# Patient Record
Sex: Male | Born: 1965 | Race: White | Hispanic: No | Smoking: Current every day smoker
Health system: Southern US, Community
[De-identification: ages and names within clinical notes are randomized; demographics above are authoritative.]

## PROBLEM LIST (undated history)

## (undated) DIAGNOSIS — E7800 Pure hypercholesterolemia, unspecified: Secondary | ICD-10-CM

## (undated) DIAGNOSIS — I10 Essential (primary) hypertension: Secondary | ICD-10-CM

## (undated) DIAGNOSIS — H9190 Unspecified hearing loss, unspecified ear: Secondary | ICD-10-CM

## (undated) HISTORY — PX: FINGER SURGERY: SHX640

## (undated) HISTORY — DX: Essential (primary) hypertension: I10

---

## 2000-05-18 ENCOUNTER — Emergency Department (HOSPITAL_COMMUNITY): Admission: EM | Admit: 2000-05-18 | Discharge: 2000-05-18 | Payer: Self-pay | Admitting: *Deleted

## 2000-05-18 ENCOUNTER — Encounter: Payer: Self-pay | Admitting: *Deleted

## 2000-08-06 ENCOUNTER — Ambulatory Visit (HOSPITAL_COMMUNITY): Admission: RE | Admit: 2000-08-06 | Discharge: 2000-08-06 | Payer: Self-pay | Admitting: Pulmonary Disease

## 2011-10-28 ENCOUNTER — Emergency Department (HOSPITAL_COMMUNITY)
Admission: EM | Admit: 2011-10-28 | Discharge: 2011-10-28 | Disposition: A | Discharge status: Home / Self Care | Payer: Self-pay | Attending: Emergency Medicine | Admitting: Emergency Medicine

## 2011-10-28 ENCOUNTER — Encounter (HOSPITAL_COMMUNITY): Payer: Self-pay

## 2011-10-28 DIAGNOSIS — F17200 Nicotine dependence, unspecified, uncomplicated: Secondary | ICD-10-CM | POA: Insufficient documentation

## 2011-10-28 DIAGNOSIS — M533 Sacrococcygeal disorders, not elsewhere classified: Secondary | ICD-10-CM | POA: Insufficient documentation

## 2011-10-28 MED ORDER — DOXYCYCLINE HYCLATE 100 MG PO TABS
100.0000 mg | ORAL_TABLET | Freq: Once | ORAL | Status: AC
  Administered 2011-10-28: 100 mg via ORAL
  Filled 2011-10-28: qty 1

## 2011-10-28 MED ORDER — DOXYCYCLINE HYCLATE 100 MG PO CAPS: 100.0000 mg | ORAL_CAPSULE | Freq: Two times a day (BID) | ORAL | Status: DC

## 2011-10-28 MED ORDER — OXYCODONE-ACETAMINOPHEN 5-325 MG PO TABS
1.0000 | ORAL_TABLET | Freq: Once | ORAL | Status: AC
  Administered 2011-10-28: 1 via ORAL
  Filled 2011-10-28: qty 1

## 2011-10-28 MED ORDER — IBUPROFEN 800 MG PO TABS
800.0000 mg | ORAL_TABLET | Freq: Once | ORAL | Status: AC
  Administered 2011-10-28: 800 mg via ORAL
  Filled 2011-10-28: qty 1

## 2011-10-28 MED ORDER — OXYCODONE-ACETAMINOPHEN 5-325 MG PO TABS: ORAL_TABLET | ORAL | Status: DC

## 2011-10-28 NOTE — ED Provider Notes (Signed)
Medical screening examination/treatment/procedure(s) were performed by non-physician practitioner and as supervising physician I was immediately available for consultation/collaboration.  John-Adam Zohar Maroney, M.D.     John-Adam Taneah Masri, MD 10/28/11 1619 

## 2011-10-28 NOTE — ED Provider Notes (Signed)
History     CSN: [id number]  Arrival date & time 10/28/11  1015   First MD Initiated Contact with Patient 10/28/11 1046      Chief Complaint  Patient presents with  . Abscess    (Consider location/radiation/quality/duration/timing/severity/associated sxs/prior treatment) HPI Comments: Area painful.  States he could feel a swollen area while taking a shower this AM.  No fever or chills.  Had one abscess ~ 3 yrs ago.  No PCP   Patient is a 46 y.o. male presenting with abscess. The history is provided by the patient. No language interpreter was used.  Abscess  This is a new problem. Episode onset: 3-4 days ago. The problem occurs continuously. The problem has been unchanged. Affected Location: pilonidal region. The abscess first occurred at home. Pertinent negatives include no fever. His past medical history does not include atopy in family or skin abscesses in family. He has received no recent medical care.    History reviewed. No pertinent past medical history.  History reviewed. No pertinent past surgical history.  No family history on file.  History  Substance Use Topics  . Smoking status: Current Every Day Smoker  . Smokeless tobacco: Not on file  . Alcohol Use: Yes      Review of Systems  Constitutional: Negative for fever and chills.  Gastrointestinal: Negative for rectal pain.  Skin: Negative for wound.  All other systems reviewed and are negative.    Allergies  Review of patient's allergies indicates no known allergies.  Home Medications   Current Outpatient Rx  Name Route Sig Dispense Refill  . IBUPROFEN 200 MG PO TABS Oral Take 400 mg by mouth every 6 (six) hours as needed. Pain      BP 175/97  Pulse 90  Temp 98.5 F (36.9 C) (Oral)  Resp 19  Wt 170 lb (77.111 kg)  SpO2 100%  Physical Exam  Nursing note and vitals reviewed. Constitutional: He is oriented to person, place, and time. He appears well-developed and well-nourished.  HENT:  Head:  Normocephalic and atraumatic.  Eyes: EOM are normal.  Neck: Normal range of motion.  Cardiovascular: Normal rate, regular rhythm and intact distal pulses.   Pulmonary/Chest: Effort normal. No respiratory distress.  Abdominal: Soft. He exhibits no distension. There is no tenderness.  Musculoskeletal: Normal range of motion.  Neurological: He is alert and oriented to person, place, and time.  Skin: Skin is warm and dry.       Pain and PT in pilonidal region.  No visible or palpable induration or fluctuance.    Psychiatric: He has a normal mood and affect. Judgment normal.    ED Course  Procedures (including critical care time)  Labs Reviewed - No data to display No results found.   1. Sacral pain       MDM  Warm compresses. rx-doxycycline, 20 rx-percocet, 20 Ibuprofen Return prn        Evalina Field, [place] 10/28/11 1253

## 2011-10-28 NOTE — ED Notes (Signed)
Patient with no complaints at this time. Respirations even and unlabored. Skin warm/dry. Discharge instructions reviewed with patient at this time. Patient given opportunity to voice concerns/ask questions. Patient discharged at this time and left Emergency Department with steady gait.   

## 2011-10-28 NOTE — ED Notes (Signed)
C/o "knot" to lower back where buttocks begins

## 2017-09-29 ENCOUNTER — Other Ambulatory Visit: Payer: Self-pay

## 2017-09-29 ENCOUNTER — Emergency Department (HOSPITAL_COMMUNITY): Payer: Self-pay

## 2017-09-29 ENCOUNTER — Encounter (HOSPITAL_COMMUNITY): Payer: Self-pay | Admitting: Emergency Medicine

## 2017-09-29 ENCOUNTER — Emergency Department (HOSPITAL_COMMUNITY)
Admission: EM | Admit: 2017-09-29 | Discharge: 2017-09-29 | Disposition: A | Discharge status: Home / Self Care | Payer: Self-pay | Attending: Emergency Medicine | Admitting: Emergency Medicine

## 2017-09-29 DIAGNOSIS — F17210 Nicotine dependence, cigarettes, uncomplicated: Secondary | ICD-10-CM | POA: Insufficient documentation

## 2017-09-29 DIAGNOSIS — M87051 Idiopathic aseptic necrosis of right femur: Secondary | ICD-10-CM

## 2017-09-29 DIAGNOSIS — Z79899 Other long term (current) drug therapy: Secondary | ICD-10-CM | POA: Insufficient documentation

## 2017-09-29 DIAGNOSIS — M87059 Idiopathic aseptic necrosis of unspecified femur: Secondary | ICD-10-CM | POA: Insufficient documentation

## 2017-09-29 DIAGNOSIS — M47816 Spondylosis without myelopathy or radiculopathy, lumbar region: Secondary | ICD-10-CM | POA: Insufficient documentation

## 2017-09-29 MED ORDER — MELOXICAM 15 MG PO TABS: 15.0000 mg | ORAL_TABLET | Freq: Every day | ORAL | 0 refills | Status: AC

## 2017-09-29 MED ORDER — LIDOCAINE 5 % EX PTCH: 1.0000 | MEDICATED_PATCH | CUTANEOUS | 0 refills | Status: DC

## 2017-09-29 NOTE — ED Triage Notes (Signed)
Patient c/o right hip since he fell off ladder after tree limb knocked him off during a strom x3 months ago. Per patient pain radiates from right hip into leg. Patient ambulatory. Patient denies being seen after fall. Patient reports taking Aleve, muscle relaxer, motrin, and naproxen with no relief. Pedal pulse present,strong. CNS intact. Denies any complications with BMs or urination.

## 2017-09-29 NOTE — Discharge Instructions (Addendum)
Thank you for allowing me to care for you today in the Emergency Department.   Call Dr. Romeo Apple to schedule a follow-up appointment.  You can also follow-up with the free clinic of [address] or with [facility] in [place].  There are some other treatments and medications that he may be able to prescribe from you from the office that may help to improve your pain.  Take 1 tablet of meloxicam daily.  Do not take ibuprofen, Motrin, Aleve, or naproxen while taking this medication as a work the same.  You should not take Tylenol if you drink alcohol.  You can apply 1 lidocaine patch to areas that are sore every 12 hours as needed.  Return to the emergency department if you develop new or worsening symptoms including if you have any fall or injury, if you develop new numbness or weakness in the right leg, if you start having problems and are peeing or pooping on yourself, or if you develop other new, concerning symptoms.

## 2017-09-29 NOTE — ED Provider Notes (Signed)
[facility] EMERGENCY DEPARTMENT Provider Note   CSN: [id number] Arrival date & time: 09/29/17  1253     History   Chief Complaint Chief Complaint  Patient presents with  . Hip Pain    HPI Cameron Flynn is a 52 y.o. 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  Patient reports that he was approximately 3 steps high on a ladder while repairing a roof when a tree limb knocked him off the ladder and he fell onto his back 3 months ago.  He reports that he was able to get up from the accident and was ambulatory after the fall.  He reports that he has been having right-sided hip pain that radiates down into the right thigh for the last 3 months.  He states that the pain initially radiated into the right foot, but now stops at the mid thigh and in the right groin.  He denies penile or testicular pain or swelling, urinary or fecal incontinence, saddle paresthesias, numbness, or weakness.  He is unable to characterize the pain.  He states that initially he became unable to walk 2 days after the accident, but borrowed a walker from a friend and was able to get himself up and walking over the next few days.  He states that he has not used a walker in some time.  He is been treating his symptoms at home with muscle relaxers that he received from a friend, Motrin, and naproxen but reports that the pain has not started to improve.  He reports that several years ago that he had an injury to the low back and was diagnosed with bulging disc in the lumbar spine.  He followed up with a chiropractor, but has not been evaluated in several years.  The history is provided by the patient. No language interpreter was used.    History reviewed. No pertinent past medical history.  There are no active problems to display for this patient.   Past Surgical History:  Procedure Laterality Date  . FINGER SURGERY           Home Medications    Prior to Admission medications   Medication Sig Start Date End Date Taking? Authorizing Provider  doxycycline (VIBRAMYCIN) 100 MG capsule Take 1 capsule (100 mg total) by mouth 2 (two) times daily. 10/28/11   Worthy Rancher, PA-C  ibuprofen (ADVIL,MOTRIN) 200 MG tablet Take 400 mg by mouth every 6 (six) hours as needed. Pain    [provider]  lidocaine (LIDODERM) 5 % Place 1 patch onto the skin daily. Remove & Discard patch within 12 hours or as directed by MD 09/29/17   Gay Rape A, PA-C  meloxicam (MOBIC) 15 MG tablet Take 1 tablet (15 mg total) by mouth daily for 14 days. 09/29/17 10/13/17  Jerra Huckeby A, PA-C  oxyCODONE-acetaminophen (PERCOCET/ROXICET) 5-325 MG per tablet One po q 6 hrs prn pain 10/28/11   Worthy Rancher, PA-C    Family History History reviewed. No pertinent family history.  Social History Social History   Tobacco Use  . Smoking status: Current Every Day Smoker    Packs/day: 2.00    Types: Cigarettes  . Smokeless tobacco: Never Used  Substance Use Topics  . Alcohol use: Yes    Comment: occasional  . Drug use: Yes    Types: Marijuana     Allergies   Patient has no  known allergies.   Review of Systems Review of Systems  Constitutional: Negative for activity change, chills and fever.  Respiratory: Negative for shortness of breath.   Cardiovascular: Negative for chest pain.  Gastrointestinal: Negative for abdominal pain.  Genitourinary: Negative for flank pain, hematuria, penile pain, scrotal swelling and testicular pain.  Musculoskeletal: Positive for arthralgias, back pain, gait problem and myalgias.  Skin: Negative for rash.  Neurological: Negative for weakness and numbness.     Physical Exam Updated Vital Signs BP (!) 154/88 (BP Location: Right Arm)   Pulse 82   Temp 98.9 F (37.2 C) (Oral)   Resp 17   Ht 5\' 11"  (1.803 m)   Wt 83.9 kg   SpO2 97%   BMI 25.80 kg/m   Physical Exam   Constitutional: He appears well-developed.  HENT:  Head: Normocephalic.  Eyes: Conjunctivae are normal.  Neck: Neck supple.  Cardiovascular: Normal rate and regular rhythm.  No murmur heard. Pulmonary/Chest: Effort normal.  Abdominal: Soft. He exhibits no distension and no mass. There is no tenderness. There is no rebound and no guarding. No hernia.  Musculoskeletal: He exhibits tenderness. He exhibits no edema or deformity.  Tender to palpation over the right SI joint.  No tenderness to the spinous processes of the cervical or thoracic spine.  Mild tenderness to the spinous processes of the lumbar spine.  No obvious deformities, step-offs, or crepitus.  Negative straight leg raise bilaterally.  Tender to palpation over the right anterolateral hip.  Full active and passive range of motion of the right hip, knee, and ankle.  5 out of 5 strength against resistance with dorsiflexion and plantarflexion of the bilateral lower extremities.  Sensation is intact and equal throughout.  Increased pain with range of motion of the right hip.  No overlying erythema, edema, or warmth.  Exam of the right knee and ankle are unremarkable.  Neurological: He is alert.  Skin: Skin is warm and dry.  Psychiatric: His behavior is normal.  Nursing note and vitals reviewed.    ED Treatments / Results  Labs (all labs ordered are listed, but only abnormal results are displayed) Labs Reviewed - No data to display  EKG None  Radiology Dg Lumbar Spine Complete  Result Date: 09/29/2017 CLINICAL DATA:  Pain following fall 3 months prior EXAM: LUMBAR SPINE - COMPLETE 4+ VIEW COMPARISON:  None. FINDINGS: Frontal, lateral, spot lumbosacral lateral, and bilateral oblique views were obtained. There are 5 non-rib-bearing lumbar type vertebral bodies. There is no appreciable fracture or spondylolisthesis. There is fairly mild disc space narrowing at L3-4 and L4-5 with moderate disc space narrowing at L5-S1. There are  prominent anterior and lateral osteophytes at L3 and L4. There is facet osteoarthritic change at L4-5 and L5-S1 bilaterally and at L3-4 on the left. There is aortoiliac atherosclerosis. 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 No fracture or spondylolisthesis. There is aortoiliac atherosclerosis. Aortic Atherosclerosis (ICD10-I70.0). Electronically Signed   By: Bretta Bang III M.D.   On: 09/29/2017 15:10   Dg Hip Unilat W Or Wo Pelvis 2-3 Views Right  Result Date: 09/29/2017 CLINICAL DATA:  Pain following fall 3 months prior EXAM: DG HIP (WITH OR WITHOUT PELVIS) 2-3V RIGHT COMPARISON:  None. FINDINGS: Frontal pelvis as well as frontal and lateral right hip images were obtained. No fracture or dislocation. There are areas of mixed sclerosis and lucency in the femoral heads, concerning for avascular necrosis in these areas. No flattening of  the femoral heads is seen. There is slight symmetric narrowing of each hip joint. Sacroiliac joints bilaterally appear normal. IMPRESSION: Slight narrowing of each hip joint. Mixed sclerosis and lucency in the femoral heads, likely indicating areas of avascular necrosis bilaterally. No fracture or dislocation evident. No femoral head flattening currently evident. From an imaging standpoint, MR of the hips nonemergently would be the optimum imaging study of choice to assess for avascular necrosis. Electronically Signed   By: Bretta Bang III M.D.   On: 09/29/2017 15:12    Procedures Procedures (including critical care time)  Medications Ordered in ED Medications - No data to display   Initial Impression / Assessment and Plan / ED Course  I have reviewed the triage vital signs and the nursing notes.  Pertinent labs & imaging results that were available during my care of the patient were reviewed by me and considered in my medical decision making (see chart for details).     52 year old male presenting  with right hip pain after he had a mechanical fall onto his back 3 months ago.  He is also having some right groin pain, but no saddle paresthesias, numbness, weakness, or urinary or fecal incontinence.  X-ray of the lumbar spine with osteoarthritic changes throughout the lumbar spine, most notably at L5-S1.  No fracture or spondylolisthesis.  There is some aorto iliac atherosclerosis.  X-ray of the right hip concerning for avascular necrosis without femoral head flattening.  Discussed these findings with the patient and his significant other.  Given the chronicity of his symptoms, I do not feel that narcotic medication is indicated at this time.  We will give meloxicam for pain control as the patient drinks alcohol regularly.  I have also advised him to follow-up with Dr. Romeo Apple and with the [facility] clinic given the atherosclerotic plaques that were seen on imaging.  Strict return precautions given.  He is hemodynamically stable and in no acute distress.  He is safe for discharge to home with outpatient follow-up at this time.  Final Clinical Impressions(s) / ED Diagnoses   Final diagnoses:  Avascular necrosis of bone of right hip (HCC)  Spondylosis of lumbar region without myelopathy or radiculopathy    ED Discharge Orders         Ordered    meloxicam (MOBIC) 15 MG tablet  Daily     09/29/17 1536    lidocaine (LIDODERM) 5 %  Every 24 hours     09/29/17 1536           Nyal Schachter A, PA-C 09/29/17 1545    Donnetta Hutching, MD 09/30/17 2132

## 2017-10-01 ENCOUNTER — Telehealth: Payer: Self-pay | Admitting: Orthopedic Surgery

## 2017-10-01 NOTE — Telephone Encounter (Signed)
ok 

## 2017-10-01 NOTE — Telephone Encounter (Signed)
Patient called, had been seen at [facility] Emergency room visit 09/29/17 (not on call on this date per call schedule) for problem of leg pain, said related to a fall 3 months ago from a ladder. Had Xrays at [facility] 09/29/17. Okay for next new patient slot? Patient aware of self-pay protocol.

## 2017-10-02 NOTE — Telephone Encounter (Signed)
Called back to patient; scheduled accordingly. °

## 2017-10-10 ENCOUNTER — Ambulatory Visit: Payer: Self-pay | Admitting: Physician Assistant

## 2017-10-10 ENCOUNTER — Encounter: Payer: Self-pay | Admitting: Physician Assistant

## 2017-10-10 ENCOUNTER — Other Ambulatory Visit (HOSPITAL_COMMUNITY)
Admission: RE | Admit: 2017-10-10 | Discharge: 2017-10-10 | Disposition: A | Discharge status: Home / Self Care | Payer: Self-pay | Source: Ambulatory Visit | Attending: Physician Assistant | Admitting: Physician Assistant

## 2017-10-10 VITALS — BP 190/98 | HR 67 | Temp 97.5°F | Ht 68.5 in | Wt 183.5 lb

## 2017-10-10 DIAGNOSIS — Z125 Encounter for screening for malignant neoplasm of prostate: Secondary | ICD-10-CM | POA: Insufficient documentation

## 2017-10-10 DIAGNOSIS — I700 Atherosclerosis of aorta: Secondary | ICD-10-CM

## 2017-10-10 DIAGNOSIS — I10 Essential (primary) hypertension: Secondary | ICD-10-CM

## 2017-10-10 DIAGNOSIS — F1090 Alcohol use, unspecified, uncomplicated: Secondary | ICD-10-CM

## 2017-10-10 DIAGNOSIS — Z13220 Encounter for screening for lipoid disorders: Secondary | ICD-10-CM | POA: Insufficient documentation

## 2017-10-10 DIAGNOSIS — Z131 Encounter for screening for diabetes mellitus: Secondary | ICD-10-CM

## 2017-10-10 DIAGNOSIS — F17200 Nicotine dependence, unspecified, uncomplicated: Secondary | ICD-10-CM

## 2017-10-10 DIAGNOSIS — Z7689 Persons encountering health services in other specified circumstances: Secondary | ICD-10-CM

## 2017-10-10 DIAGNOSIS — Z789 Other specified health status: Secondary | ICD-10-CM

## 2017-10-10 LAB — HEMOGLOBIN A1C
Hgb A1c MFr Bld: 6.1 % — ABNORMAL HIGH (ref 4.8–5.6)
MEAN PLASMA GLUCOSE: 128.37 mg/dL

## 2017-10-10 LAB — COMPREHENSIVE METABOLIC PANEL
ALT: 52 U/L — ABNORMAL HIGH (ref 0–44)
AST: 35 U/L (ref 15–41)
Albumin: 4.3 g/dL (ref 3.5–5.0)
Alkaline Phosphatase: 89 U/L (ref 38–126)
Anion gap: 10 (ref 5–15)
BUN: 10 mg/dL (ref 6–20)
CHLORIDE: 102 mmol/L (ref 98–111)
CO2: 25 mmol/L (ref 22–32)
Calcium: 9.1 mg/dL (ref 8.9–10.3)
Creatinine, Ser: 0.89 mg/dL (ref 0.61–1.24)
Glucose, Bld: 118 mg/dL — ABNORMAL HIGH (ref 70–99)
POTASSIUM: 4.3 mmol/L (ref 3.5–5.1)
SODIUM: 137 mmol/L (ref 135–145)
Total Bilirubin: 1 mg/dL (ref 0.3–1.2)
Total Protein: 7.9 g/dL (ref 6.5–8.1)

## 2017-10-10 LAB — LIPID PANEL
CHOL/HDL RATIO: 4.6 ratio
CHOLESTEROL: 206 mg/dL — AB (ref 0–200)
HDL: 45 mg/dL (ref >40)
LDL Cholesterol: 145 mg/dL — ABNORMAL HIGH (ref 0–99)
TRIGLYCERIDES: 81 mg/dL (ref <150)
VLDL: 16 mg/dL (ref 0–40)

## 2017-10-10 LAB — PSA: PROSTATIC SPECIFIC ANTIGEN: 0.25 ng/mL (ref 0.00–4.00)

## 2017-10-10 MED ORDER — LISINOPRIL 20 MG PO TABS: 20.0000 mg | ORAL_TABLET | Freq: Every day | ORAL | 1 refills | Status: DC

## 2017-10-10 MED ORDER — CLONIDINE HCL 0.1 MG PO TABS
0.1000 mg | ORAL_TABLET | Freq: Once | ORAL | Status: AC
  Administered 2017-10-10: 0.1 mg via ORAL

## 2017-10-10 NOTE — Patient Instructions (Signed)
Smoking Tobacco Information Smoking tobacco will very likely harm your health. Tobacco contains a poisonous (toxic), colorless chemical called nicotine. Nicotine affects the brain and makes tobacco addictive. This change in your brain can make it hard to stop smoking. Tobacco also has other toxic chemicals that can hurt your body and raise your risk of many cancers. How can smoking tobacco affect me? Smoking tobacco can increase your chances of having serious health conditions, such as:  Cancer. Smoking is most commonly associated with lung cancer, but can lead to cancer in other parts of the body.  Chronic obstructive pulmonary disease (COPD). This is a long-term lung condition that makes it hard to breathe. It also gets worse over time.  High blood pressure (hypertension), heart disease, stroke, or heart attack.  Lung infections, such as pneumonia.  Cataracts. This is when the lenses in the eyes become clouded.  Digestive problems. This may include peptic ulcers, heartburn, and gastroesophageal reflux disease (GERD).  Oral health problems, such as gum disease and tooth loss.  Loss of taste and smell.  Smoking can affect your appearance by causing:  Wrinkles.  Yellow or stained teeth, fingers, and fingernails.  Smoking tobacco can also affect your social life.  Many workplaces, restaurants, hotels, and public places are tobacco-free. This means that you may experience challenges in finding places to smoke when away from home.  The cost of a smoking habit can be expensive. Expenses for someone who smokes come in two ways: ? You spend money on a regular basis to buy tobacco. ? Your health care costs in the long-term are higher if you smoke.  Tobacco smoke can also affect the health of those around you. Children of smokers have greater chances of: ? Sudden infant death syndrome (SIDS). ? Ear infections. ? Lung infections.  What lifestyle changes can be made?  Do not start  smoking. Quit if you already do.  To quit smoking: ? Make a plan to quit smoking and commit yourself to it. Look for programs to help you and ask your health care provider for recommendations and ideas. ? Talk with your health care provider about using nicotine replacement medicines to help you quit. Medicine replacement medicines include gum, lozenges, patches, sprays, or pills. ? Do not replace cigarette smoking with electronic cigarettes, which are commonly called e-cigarettes. The safety of e-cigarettes is not known, and some may contain harmful chemicals. ? Avoid places, people, or situations that tempt you to smoke. ? If you try to quit but return to smoking, don't give up hope. It is very common for people to try a number of times before they fully succeed. When you feel ready again, give it another try.  Quitting smoking might affect the way you eat as well as your weight. Be prepared to monitor your eating habits. Get support in planning and following a healthy diet.  Ask your health care provider about having regular tests (screenings) to check for cancer. This may include blood tests, imaging tests, and other tests.  Exercise regularly. Consider taking walks, joining a gym, or doing yoga or exercise classes.  Develop skills to manage your stress. These skills include meditation. What are the benefits of quitting smoking? By quitting smoking, you may:  Lower your risk of getting cancer and other diseases caused by smoking.  Live longer.  Breathe better.  Lower your blood pressure and heart rate.  Stop your addiction to tobacco.  Stop creating secondhand smoke that hurts other people.  Improve your   sense of taste and smell.  Look better over time, due to having fewer wrinkles and less staining.  What can happen if changes are not made? If you do not stop smoking, you may:  Get cancer and other diseases.  Develop COPD or other long-term (chronic) lung  conditions.  Develop serious problems with your heart and blood vessels (cardiovascular system).  Need more tests to screen for problems caused by smoking.  Have higher, long-term healthcare costs from medicines or treatments related to smoking.  Continue to have worsening changes in your lungs, mouth, and nose.  Where to find support: To get support to quit smoking, consider:  Asking your health care provider for more information and resources.  Taking classes to learn more about quitting smoking.  Looking for local organizations that offer resources about quitting smoking.  Joining a support group for people who want to quit smoking in your local community.  Where to find more information: You may find more information about quitting smoking from:  HelpGuide.org: www.helpguide.org/articles/addictions/how-to-quit-smoking.htm  Smokefree.gov: smokefree.gov  American Lung Association: www.lung.org  Contact a health care provider if:  You have problems breathing.  Your lips, nose, or fingers turn blue.  You have chest pain.  You are coughing up blood.  You feel faint or you pass out.  You have other noticeable changes that cause you to worry. Summary  Smoking tobacco can negatively affect your health, the health of those around you, your finances, and your social life.  Do not start smoking. Quit if you already do. If you need help quitting, ask your health care provider.  Think about joining a support group for people who want to quit smoking in your local community. There are many effective programs that will help you to quit this behavior. This information is not intended to replace advice given to you by your health care provider. Make sure you discuss any questions you have with your health care provider. Document Released: 01/03/2016 Document Revised: 01/03/2016 Document Reviewed: 01/03/2016 Elsevier Interactive Patient Education  2018 [employer]  Inc[employer]     Hypertension Hypertension, commonly called high blood pressure, is when the force of blood pumping through the arteries is too strong. The arteries are the blood vessels that carry blood from the heart throughout the body. Hypertension forces the heart to work harder to pump blood and may cause arteries to become narrow or stiff. Having untreated or uncontrolled hypertension can cause heart attacks, strokes, kidney disease, and other problems. A blood pressure reading consists of a higher number over a lower number. Ideally, your blood pressure should be below 120/80. The first ("top") number is called the systolic pressure. It is a measure of the pressure in your arteries as your heart beats. The second ("bottom") number is called the diastolic pressure. It is a measure of the pressure in your arteries as the heart relaxes. What are the causes? The cause of this condition is not known. What increases the risk? Some risk factors for high blood pressure are under your control. Others are not. Factors you can change  Smoking.  Having type 2 diabetes mellitus, high cholesterol, or both.  Not getting enough exercise or physical activity.  Being overweight.  Having too much fat, sugar, calories, or salt (sodium) in your diet.  Drinking too much alcohol. Factors that are difficult or impossible to change  Having chronic kidney disease.  Having a family history of high blood pressure.  Age. Risk increases with age.  Race. You may   be at higher risk if you are African-American.  Gender. Men are at higher risk than women before age 45. After age 65, women are at higher risk than men.  Having obstructive sleep apnea.  Stress. What are the signs or symptoms? Extremely high blood pressure (hypertensive crisis) may cause:  Headache.  Anxiety.  Shortness of breath.  Nosebleed.  Nausea and vomiting.  Severe chest pain.  Jerky movements you cannot control  (seizures).  How is this diagnosed? This condition is diagnosed by measuring your blood pressure while you are seated, with your arm resting on a surface. The cuff of the blood pressure monitor will be placed directly against the skin of your upper arm at the level of your heart. It should be measured at least twice using the same arm. Certain conditions can cause a difference in blood pressure between your right and left arms. Certain factors can cause blood pressure readings to be lower or higher than normal (elevated) for a short period of time:  When your blood pressure is higher when you are in a health care provider's office than when you are at home, this is called white coat hypertension. Most people with this condition do not need medicines.  When your blood pressure is higher at home than when you are in a health care provider's office, this is called masked hypertension. Most people with this condition may need medicines to control blood pressure.  If you have a high blood pressure reading during one visit or you have normal blood pressure with other risk factors:  You may be asked to return on a different day to have your blood pressure checked again.  You may be asked to monitor your blood pressure at home for 1 week or longer.  If you are diagnosed with hypertension, you may have other blood or imaging tests to help your health care provider understand your overall risk for other conditions. How is this treated? This condition is treated by making healthy lifestyle changes, such as eating healthy foods, exercising more, and reducing your alcohol intake. Your health care provider may prescribe medicine if lifestyle changes are not enough to get your blood pressure under control, and if:  Your systolic blood pressure is above 130.  Your diastolic blood pressure is above 80.  Your personal target blood pressure may vary depending on your medical conditions, your age, and other  factors. Follow these instructions at home: Eating and drinking  Eat a diet that is high in fiber and potassium, and low in sodium, added sugar, and fat. An example eating plan is called the DASH (Dietary Approaches to Stop Hypertension) diet. To eat this way: ? Eat plenty of fresh fruits and vegetables. Try to fill half of your plate at each meal with fruits and vegetables. ? Eat whole grains, such as whole wheat pasta, brown rice, or whole grain bread. Fill about one quarter of your plate with whole grains. ? Eat or drink low-fat dairy products, such as skim milk or low-fat yogurt. ? Avoid fatty cuts of meat, processed or cured meats, and poultry with skin. Fill about one quarter of your plate with lean proteins, such as fish, chicken without skin, beans, eggs, and tofu. ? Avoid premade and processed foods. These tend to be higher in sodium, added sugar, and fat.  Reduce your daily sodium intake. Most people with hypertension should eat less than 1,500 mg of sodium a day.  Limit alcohol intake to no more than 1 drink   a day for nonpregnant women and 2 drinks a day for men. One drink equals 12 oz of beer, 5 oz of wine, or 1 oz of hard liquor. Lifestyle  Work with your health care provider to maintain a healthy body weight or to lose weight. Ask what an ideal weight is for you.  Get at least 30 minutes of exercise that causes your heart to beat faster (aerobic exercise) most days of the week. Activities may include walking, swimming, or biking.  Include exercise to strengthen your muscles (resistance exercise), such as pilates or lifting weights, as part of your weekly exercise routine. Try to do these types of exercises for 30 minutes at least 3 days a week.  Do not use any products that contain nicotine or tobacco, such as cigarettes and e-cigarettes. If you need help quitting, ask your health care provider.  Monitor your blood pressure at home as told by your health care provider.  Keep  all follow-up visits as told by your health care provider. This is important. Medicines  Take over-the-counter and prescription medicines only as told by your health care provider. Follow directions carefully. Blood pressure medicines must be taken as prescribed.  Do not skip doses of blood pressure medicine. Doing this puts you at risk for problems and can make the medicine less effective.  Ask your health care provider about side effects or reactions to medicines that you should watch for. Contact a health care provider if:  You think you are having a reaction to a medicine you are taking.  You have headaches that keep coming back (recurring).  You feel dizzy.  You have swelling in your ankles.  You have trouble with your vision. Get help right away if:  You develop a severe headache or confusion.  You have unusual weakness or numbness.  You feel faint.  You have severe pain in your chest or abdomen.  You vomit repeatedly.  You have trouble breathing. Summary  Hypertension is when the force of blood pumping through your arteries is too strong. If this condition is not controlled, it may put you at risk for serious complications.  Your personal target blood pressure may vary depending on your medical conditions, your age, and other factors. For most people, a normal blood pressure is less than 120/80.  Hypertension is treated with lifestyle changes, medicines, or a combination of both. Lifestyle changes include weight loss, eating a healthy, low-sodium diet, exercising more, and limiting alcohol. This information is not intended to replace advice given to you by your health care provider. Make sure you discuss any questions you have with your health care provider. Document Released: 12/18/2004 Document Revised: 11/16/2015 Document Reviewed: 11/16/2015 Elsevier Interactive Patient Education  2018 [employer]vier Inc.   

## 2017-10-10 NOTE — Progress Notes (Signed)
BP (!) 190/98   Pulse 67   Temp (!) 97.5 F (36.4 C)   Ht 5' 8.5" (1.74 m)   Wt 183 lb 8 oz (83.2 kg)   SpO2 100%   BMI 27.50 kg/m    Subjective:    Patient ID: Cameron Flynn, male    DOB: 1965/07/15, 52 y.o.   MRN: [id number]  HPI: Cameron Flynn is a 52 y.o. male presenting on 10/10/2017 for No chief complaint on file.   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  He has appointment to see orthopedics on 10/21/17.  Pt had a fall off a ladder about 3 months ago.  Pt states no PCP since he was a teenager.   Pt is pleasant but is a difficult historian and has trouble answering even simple questions- he seems to overthink every question even easy ones like does your back hurt. He denies HA, CP, vision changes.  Of note pt smokes 2ppd and drinks at least a 6 pack of beer or more daily.   Relevant past medical, surgical, family and social history reviewed and updated as indicated. Interim medical history since our last visit reviewed. Allergies and medications reviewed and updated.   Current Outpatient Medications:  .  meloxicam (MOBIC) 15 MG tablet, Take 1 tablet (15 mg total) by mouth daily for 14 days., Disp: 14 tablet, Rfl: 0 .  lidocaine (LIDODERM) 5 %, Place 1 patch onto the skin daily. Remove & Discard patch within 12 hours or as directed by MD (Patient not taking: Reported on 10/10/2017), Disp: 30 patch, Rfl: 0   Review of Systems  Constitutional: Negative for appetite change, chills, diaphoresis, fatigue, fever and unexpected weight change.  HENT: Positive for hearing loss. Negative for congestion, dental problem, drooling, ear pain, facial swelling, mouth sores, sneezing, sore throat, trouble swallowing and voice change.   Eyes: Negative for pain, discharge, redness, itching and visual disturbance.  Respiratory: Negative for cough, choking, shortness of breath and wheezing.    Cardiovascular: Negative for chest pain, palpitations and leg swelling.  Gastrointestinal: Negative for abdominal pain, blood in stool, constipation, diarrhea and vomiting.  Endocrine: Negative for cold intolerance, heat intolerance and polydipsia.  Genitourinary: Negative for decreased urine volume, dysuria and hematuria.  Musculoskeletal: Negative for arthralgias, back pain and gait problem.  Skin: Negative for rash.  Allergic/Immunologic: Negative for environmental allergies.  Neurological: Negative for seizures, syncope, light-headedness and headaches.  Hematological: Negative for adenopathy.  Psychiatric/Behavioral: Negative for agitation, dysphoric mood and suicidal ideas. The patient is not nervous/anxious.     Per HPI unless specifically indicated above     Objective:    BP (!) 190/98   Pulse 67   Temp (!) 97.5 F (36.4 C)   Ht 5' 8.5" (1.74 m)   Wt 183 lb 8 oz (83.2 kg)   SpO2 100%   BMI 27.50 kg/m   Wt Readings from Last 3 Encounters:  10/10/17 183 lb 8 oz (83.2 kg)  09/29/17 185 lb (83.9 kg)  10/28/11 170 lb (77.1 kg)    Physical Exam  Constitutional: He is oriented to person, place, and time. He appears well-developed and well-nourished.  HENT:  Head: Normocephalic and atraumatic.  Mouth/Throat: Oropharynx is clear and moist. No oropharyngeal exudate.  Eyes: Pupils are equal, round, and reactive to light. Conjunctivae and EOM are normal.  Neck: Neck supple. No thyromegaly present.  Cardiovascular: Normal rate and regular rhythm.  Pulmonary/Chest: Effort normal and breath sounds normal. He has no wheezes. He has no rales.  Abdominal: Soft. Bowel sounds are normal. He exhibits no mass. There is no hepatosplenomegaly. There is no tenderness.  Musculoskeletal: He exhibits no edema.  Lymphadenopathy:    He has no cervical adenopathy.  Neurological: He is alert and oriented to person, place, and time.  Skin: Skin is warm and dry. No rash noted.  Psychiatric: He  has a normal mood and affect. His behavior is normal. Thought content normal.  Vitals reviewed.   No results found for this or any previous visit.    Assessment & Plan:    Encounter Diagnoses  Name Primary?  . Encounter to establish care Yes  . Essential hypertension   . Screening cholesterol level   . Aortic atherosclerosis (HCC)   . Screening for diabetes mellitus   . Tobacco use disorder   . Screening for prostate cancer   . Heavy alcohol use     -pt [facility] -pt to keep appointment with orthopedics for the avascular necrosis -will get baseline labs -pt will be started on lisinopril for elevated blood pressure -counseled smoking cessation.  Encouraged pt to decrease etoh to no more than 2 drinks daily -pt to follow up 3 weeks to recheck blood pressure.  RTO sooner prn

## 2017-10-21 ENCOUNTER — Ambulatory Visit: Payer: Self-pay | Admitting: Orthopedic Surgery

## 2017-10-21 ENCOUNTER — Encounter: Payer: Self-pay | Admitting: Orthopedic Surgery

## 2017-10-21 VITALS — BP 153/87 | HR 78 | Ht 68.5 in | Wt 185.0 lb

## 2017-10-21 DIAGNOSIS — M5137 Other intervertebral disc degeneration, lumbosacral region: Secondary | ICD-10-CM

## 2017-10-21 DIAGNOSIS — F17219 Nicotine dependence, cigarettes, with unspecified nicotine-induced disorders: Secondary | ICD-10-CM

## 2017-10-21 DIAGNOSIS — M87059 Idiopathic aseptic necrosis of unspecified femur: Secondary | ICD-10-CM

## 2017-10-21 DIAGNOSIS — M1610 Unilateral primary osteoarthritis, unspecified hip: Secondary | ICD-10-CM

## 2017-10-21 NOTE — Patient Instructions (Signed)
Total Hip Replacement Total hip replacement is a surgical procedure to remove damaged bone in your hip joint and replace it with an artificial hip joint (prosthetic hip joint). The purpose of this surgery is to reduce pain and to improve your hip function. During a total hip replacement, one or both parts of the hip joint are replaced, depending on the type of joint damage you have. The hip is a ball-and-socket type of joint, and it has two main parts. The ball part of the joint (femoral head) is the top of the thigh bone (femur). The socket part of the joint is a large indent in the side of your pelvis (acetabulum) where the femur and pelvis meet. Tell a health care provider about:  Any allergies you have.  All medicines you are taking, including vitamins, herbs, eye drops, creams, and over-the-counter medicines.  Any problems you or family members have had with anesthetic medicines.  Any blood disorders you have.  Any surgeries you have had.  Any medical conditions you have. What are the risks? Generally, total hip replacement is a safe procedure. However, problems can occur, including:  Infection.  Dislocation (the ball of the hip-joint prosthesis comes out of contact with the socket).  Loosening of the piece (stem) that connects the prosthetic femoral head to the femur.  Fracture of the bone while inserting the prosthesis.  Formation of blood clots, which can break loose and travel to and injure your lungs (pulmonary embolus).  What happens before the procedure?  Plan to have someone take you home after the procedure.  Do not eat or drink anything after midnight on the night before the procedure or as directed by your health care provider.  Ask your health care provider about: ? Changing or stopping your regular medicines. This is especially important if you are taking diabetes medicines or blood thinners. ? Taking medicines such as aspirin and ibuprofen. These medicines can  thin your blood. Do not take these medicines before your procedure if your health care provider asks you not to.  Ask your health care provider about how your surgical site will be marked or identified.  You may be given antibiotic medicines to help prevent infection. What happens during the procedure?  To reduce your risk of infection: ? Your health care team will wash or sanitize their hands. ? Your skin will be washed with soap.  An IV tube will be inserted into one of your veins. You will be given one or more of the following: ? A medicine that makes you drowsy (sedative). ? A medicine that makes you fall asleep (general anesthetic). ? A medicine injected into your spine that numbs your body below the waist (spinal anesthetic).  An incision will be made in your hip. Your surgeon will take out any damaged cartilage and bone.  Your surgeon will then: ? Insert a prosthetic socket into the acetabulum of your pelvis. This is usually secured with screws. ? Remove the femoral head and replace it with a prosthetic ball and stem secured into the top of your femur. ? Place the ball into the socket and check the range of motion and stability of your new hip. ? Close the incision and apply a bandage over the surgical site. What happens after the procedure?  You will stay in a recovery area until the medicines have worn off.  Your vital signs, such as your pulse and blood pressure, will be monitored.  Once you are awake and stable, you   will be taken to a hospital room.  You may be directed to take actions to help prevent blood clots. These may include: ? Walking soon after surgery, with someone assisting you. Moving around after surgery helps to improve blood flow. ? Taking medicines to thin your blood (anticoagulants). ? Wearing compression stockings or using different types of devices.  You will receive physical therapy until you are doing well and your health care provider feels it is  safe for you to go home. This information is not intended to replace advice given to you by your health care provider. Make sure you discuss any questions you have with your health care provider. Document Released: 03/26/2000 Document Revised: 08/22/2015 Document Reviewed: 02/18/2013 Elsevier Interactive Patient Education  2018 [employer]  

## 2017-10-21 NOTE — Progress Notes (Signed)
NEW PATIENT OFFICE VISIT  Chief Complaint  Patient presents with  . Hip Pain    right groin/ hip pain x 3 months no injury     52 Male [facility]  He complains of right groin pain for the last 3 months no trauma.  The pain became so severe that he had to crawl around for several days.  The pain is been episodic comes and goes with increasing severity at times.  He has associated difficulty walking when the pain is severe.  No prior treatment.  History of hypertension 1 to 2 pack/day smoker     Review of Systems  Constitutional: Negative for chills and fever.  Respiratory: Positive for cough. Negative for shortness of breath.   Cardiovascular: Negative for chest pain.  Musculoskeletal: Positive for back pain.  Neurological: Negative for tingling.  All other systems reviewed and are negative.   History reviewed. No pertinent past medical history.    Past Surgical History:  Procedure Laterality Date  . FINGER SURGERY Right age 52   index    Family History  Problem Relation Age of Onset  . Cancer Father    Social History   Tobacco Use  . Smoking status: Current Every Day Smoker    Packs/day: 2.00    Years: 34.00    Pack years: 68.00    Types: Cigarettes  . Smokeless tobacco: Never Used  Substance Use Topics  . Alcohol use: Yes    Comment: 6 pack/day or more  . Drug use: Not Currently    Types: Marijuana    Comment: MJ as a teen    No Known Allergies  Current Meds  Medication Sig  . lisinopril (PRINIVIL,ZESTRIL) 20 MG tablet Take 1 tablet (20 mg total) by mouth daily.    BP (!) 153/87   Pulse 78   Ht 5' 8.5" (1.74 m)   Wt 185 lb (83.9 kg)   BMI 27.72 kg/m   Physical Exam  Constitutional: He is oriented to person, place, and time. He appears well-developed and well-nourished.  Neurological: He is alert and oriented to person, place, and time. Gait abnormal.  Psychiatric: He has a normal mood and  affect. His behavior is normal. Judgment and thought content normal.   Right and left upper extremity normal alignment no asymmetry crepitation defects tenderness masses or effusion full range of motion is recorded all joints are stable muscle strength and tone are excellent  Normal mood flat affect oriented x3 coordination finger-to-nose is normal poor on the lower extremity secondary to poor hip rotation reflexes normal negative stretch tests   Right Hip Exam   Tenderness  The patient is experiencing no tenderness.   Range of Motion  Internal rotation: abnormal   Muscle Strength  The patient has normal right hip strength.  Tests  FABER: negative  Other  Erythema: absent Sensation: normal Pulse: present  Comments:  HIP STABILITY NORMAL    Left Hip Exam  Left hip exam is normal.  Tenderness  The patient is experiencing no tenderness.   Range of Motion  The patient has normal left hip ROM.  Muscle Strength  The patient has normal left hip strength.   Tests  FABER: negative  Other  Erythema: absent Sensation: normal Pulse: present  Comments:  HIP STABILITY NORMAL         MEDICAL DECISION SECTION  Xrays were done at Hospital  My independent reading of xrays:  Lumbar  disc disease severe  Bilateral AVN of the hips  IMPRESSION: Slight narrowing of each hip joint. Mixed sclerosis and lucency in the femoral heads, likely indicating areas of avascular necrosis bilaterally. No fracture or dislocation evident. No femoral head flattening currently evident.   From an imaging standpoint, MR of the hips nonemergently would be the optimum imaging study of choice to assess for avascular necrosis.     Electronically Signed   By: Bretta Bang III M.D.   On: 09/29/2017 15:12 EXAM: LUMBAR SPINE - COMPLETE 4+ VIEW   COMPARISON:  None.   FINDINGS: Frontal, lateral, spot lumbosacral lateral, and bilateral oblique views were obtained. There are 5  non-rib-bearing lumbar type vertebral bodies. There is no appreciable fracture or spondylolisthesis. There is fairly mild disc space narrowing at L3-4 and L4-5 with moderate disc space narrowing at L5-S1. There are prominent anterior and lateral osteophytes at L3 and L4. There is facet osteoarthritic change at L4-5 and L5-S1 bilaterally and at L3-4 on the left. There is aortoiliac atherosclerosis.   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 No fracture or spondylolisthesis. There is aortoiliac atherosclerosis.   Aortic Atherosclerosis (ICD10-I70.0).     Electronically Signed   By: Bretta Bang III M.D.   On: 09/29/2017 15:10    Result History     Encounter Diagnoses  Name Primary?  . Hip arthritis Yes  . Avascular necrosis of femur, unspecified laterality (HCC)   . DDD (degenerative disc disease), lumbosacral   . Cigarette nicotine dependence with nicotine-induced disorder     PLAN: (Rx., injectx, surgery, frx, mri/ct) #1 recommend stop smoking  #2 recommend complete [facility] discount paperwork  #3 Tylenol Advil Aleve for pain  #4 follow-up when all this paperwork is complete he will need a total hip on both sides  No orders of the defined types were placed in this encounter.   Fuller Canada, MD  10/21/2017 1:59 PM

## 2017-10-31 ENCOUNTER — Ambulatory Visit: Payer: Self-pay | Admitting: Physician Assistant

## 2017-10-31 ENCOUNTER — Encounter: Payer: Self-pay | Admitting: Physician Assistant

## 2017-10-31 VITALS — BP 151/85 | HR 75 | Temp 97.9°F | Ht 68.5 in | Wt 186.0 lb

## 2017-10-31 DIAGNOSIS — R7303 Prediabetes: Secondary | ICD-10-CM

## 2017-10-31 DIAGNOSIS — E785 Hyperlipidemia, unspecified: Secondary | ICD-10-CM | POA: Insufficient documentation

## 2017-10-31 DIAGNOSIS — I10 Essential (primary) hypertension: Secondary | ICD-10-CM

## 2017-10-31 DIAGNOSIS — F17200 Nicotine dependence, unspecified, uncomplicated: Secondary | ICD-10-CM

## 2017-10-31 DIAGNOSIS — F1090 Alcohol use, unspecified, uncomplicated: Secondary | ICD-10-CM | POA: Insufficient documentation

## 2017-10-31 DIAGNOSIS — I700 Atherosclerosis of aorta: Secondary | ICD-10-CM

## 2017-10-31 DIAGNOSIS — M87059 Idiopathic aseptic necrosis of unspecified femur: Secondary | ICD-10-CM

## 2017-10-31 DIAGNOSIS — Z789 Other specified health status: Secondary | ICD-10-CM | POA: Insufficient documentation

## 2017-10-31 MED ORDER — LISINOPRIL 20 MG PO TABS: 40.0000 mg | ORAL_TABLET | Freq: Every day | ORAL | 1 refills | Status: DC

## 2017-10-31 MED ORDER — ATORVASTATIN CALCIUM 20 MG PO TABS: 20.0000 mg | ORAL_TABLET | Freq: Every day | ORAL | 4 refills | Status: DC

## 2017-10-31 NOTE — Progress Notes (Signed)
BP (!) 151/85 (BP Location: Right Arm, Patient Position: Sitting, Cuff Size: Normal)   Pulse 75   Temp 97.9 F (36.6 C)   Ht 5' 8.5" (1.74 m)   Wt 186 lb (84.4 kg)   SpO2 97%   BMI 27.87 kg/m    Subjective:    Patient ID: Cameron Flynn, male    DOB: [birth year], 52 y.o.   MRN: [id number]  HPI: Cameron Flynn is a 52 y.o. male presenting on 10/31/2017 for Follow-up   HPI   Pt has not yet turned in [facility] [employer] [facility] application.  He is having problem getting his tax records  Pt has not cut back his drinking.  Pt's girlfriend is with him today  Pt has no new complaints today  Relevant past medical, surgical, family and social history reviewed and updated as indicated. Interim medical history since our last visit reviewed. Allergies and medications reviewed and updated.   Current Outpatient Medications:  .  lisinopril (PRINIVIL,ZESTRIL) 20 MG tablet, Take 1 tablet (20 mg total) by mouth daily., Disp: 30 tablet, Rfl: 1 .  naproxen sodium (ALEVE) 220 MG tablet, Take 220 mg by mouth., Disp: , Rfl:  .  lidocaine (LIDODERM) 5 %, Place 1 patch onto the skin daily. Remove & Discard patch within 12 hours or as directed by MD (Patient not taking: Reported on 10/10/2017), Disp: 30 patch, Rfl: 0  Review of Systems  Constitutional: Negative for appetite change, chills, diaphoresis, fatigue, fever and unexpected weight change.  HENT: Positive for hearing loss. Negative for congestion, dental problem, drooling, ear pain, facial swelling, mouth sores, sneezing, sore throat, trouble swallowing and voice change.   Eyes: Negative for pain, discharge, redness, itching and visual disturbance.  Respiratory: Negative for cough, choking, shortness of breath and wheezing.   Cardiovascular: Negative for chest pain, palpitations and leg swelling.  Gastrointestinal: Negative for abdominal pain, blood in stool, constipation, diarrhea and vomiting.  Endocrine: Negative for cold intolerance, heat  intolerance and polydipsia.  Genitourinary: Negative for decreased urine volume, dysuria and hematuria.  Musculoskeletal: Negative for arthralgias, back pain and gait problem.  Skin: Negative for rash.  Allergic/Immunologic: Negative for environmental allergies.  Neurological: Negative for seizures, syncope, light-headedness and headaches.  Hematological: Negative for adenopathy.  Psychiatric/Behavioral: Negative for agitation, dysphoric mood and suicidal ideas. The patient is not nervous/anxious.     Per HPI unless specifically indicated above     Objective:    BP (!) 151/85 (BP Location: Right Arm, Patient Position: Sitting, Cuff Size: Normal)   Pulse 75   Temp 97.9 F (36.6 C)   Ht 5' 8.5" (1.74 m)   Wt 186 lb (84.4 kg)   SpO2 97%   BMI 27.87 kg/m   Wt Readings from Last 3 Encounters:  10/31/17 186 lb (84.4 kg)  10/21/17 185 lb (83.9 kg)  10/10/17 183 lb 8 oz (83.2 kg)    Physical Exam  Constitutional: He is oriented to person, place, and time. He appears well-developed and well-nourished.  HENT:  Head: Normocephalic and atraumatic.  Neck: Neck supple.  Cardiovascular: Normal rate and regular rhythm.  Pulmonary/Chest: Effort normal and breath sounds normal. He has no wheezes.  Abdominal: Soft. Bowel sounds are normal. There is no hepatosplenomegaly. There is no tenderness.  Musculoskeletal: He exhibits no edema.  Lymphadenopathy:    He has no cervical adenopathy.  Neurological: He is alert and oriented to person, place, and time.  Skin: Skin is warm and dry.  Psychiatric: He has  a normal mood and affect. His behavior is normal.  Vitals reviewed.   Results for orders placed or performed during the hospital encounter of 10/10/17  Hemoglobin A1c  Result Value Ref Range   Hgb A1c MFr Bld 6.1 (H) 4.8 - 5.6 %   Mean Plasma Glucose 128.37 mg/dL  PSA  Result Value Ref Range   Prostatic Specific Antigen 0.25 0.00 - 4.00 ng/mL  Comprehensive metabolic panel  Result  Value Ref Range   Sodium 137 135 - 145 mmol/L   Potassium 4.3 3.5 - 5.1 mmol/L   Chloride 102 98 - 111 mmol/L   CO2 25 22 - 32 mmol/L   Glucose, Bld 118 (H) 70 - 99 mg/dL   BUN 10 6 - 20 mg/dL   Creatinine, Ser 8.41 0.61 - 1.24 mg/dL   Calcium 9.1 8.9 - 32.4 mg/dL   Total Protein 7.9 6.5 - 8.1 g/dL   Albumin 4.3 3.5 - 5.0 g/dL   AST 35 15 - 41 U/L   ALT 52 (H) 0 - 44 U/L   Alkaline Phosphatase 89 38 - 126 U/L   Total Bilirubin 1.0 0.3 - 1.2 mg/dL   GFR calc non Af Amer >60 >60 mL/min   GFR calc Af Amer >60 >60 mL/min   Anion gap 10 5 - 15  Lipid panel  Result Value Ref Range   Cholesterol 206 (H) 0 - 200 mg/dL   Triglycerides 81 <401 mg/dL   HDL 45 >02 mg/dL   Total CHOL/HDL Ratio 4.6 RATIO   VLDL 16 0 - 40 mg/dL   LDL Cholesterol 725 (H) 0 - 99 mg/dL      Assessment & Plan:   Encounter Diagnoses  Name Primary?  . Essential hypertension Yes  . Hyperlipidemia, unspecified hyperlipidemia type   . Tobacco use disorder   . Heavy alcohol use   . Prediabetes   . Aortic atherosclerosis (HCC)   . Avascular necrosis of bone of hip, unspecified laterality (HCC)      -reviewed labs with pt -counseled pt on smoking cessation -counseled pt on need to avoid alcohol (or definately no more than 2 daily) -counseled on low fat diet.  rx atorvastatin -will increase the lisinopril for HTN -pt to work on getting [facility] application submitted and follow up with orthopedics thereafter -pt is counseled on prediabetes and is given reading information -pt to follow up here 1 month to recheck blood pressure.  RTO sooner prn

## 2017-10-31 NOTE — Patient Instructions (Signed)
Cholesterol Cholesterol is a white, waxy, fat-like substance that is needed by the human body in small amounts. The liver makes all the cholesterol we need. Cholesterol is carried from the liver by the blood through the blood vessels. Deposits of cholesterol (plaques) may build up on blood vessel (artery) walls. Plaques make the arteries narrower and stiffer. Cholesterol plaques increase the risk for heart attack and stroke. You cannot feel your cholesterol level even if it is very high. The only way to know that it is high is to have a blood test. Once you know your cholesterol levels, you should keep a record of the test results. Work with your health care provider to keep your levels in the desired range. What do the results mean?  Total cholesterol is a rough measure of all the cholesterol in your blood.  LDL (low-density lipoprotein) is the "bad" cholesterol. This is the type that causes plaque to build up on the artery walls. You want this level to be low.  HDL (high-density lipoprotein) is the "good" cholesterol because it cleans the arteries and carries the LDL away. You want this level to be high.  Triglycerides are fat that the body can either burn for energy or store. High levels are closely linked to heart disease. What are the desired levels of cholesterol?  Total cholesterol below 200.  LDL below 100 for people who are at risk, below 70 for people at very high risk.  HDL above 40 is good. A level of 60 or higher is considered to be protective against heart disease.  Triglycerides below 150. How can I lower my cholesterol? Diet Follow your diet program as told by your health care provider.  Choose fish or white meat chicken and [place], roasted or baked. Limit fatty cuts of red meat, fried foods, and processed meats, such as sausage and lunch meats.  Eat lots of fresh fruits and vegetables.  Choose whole grains, beans, pasta, potatoes, and cereals.  Choose olive oil,  corn oil, or canola oil, and use only small amounts.  Avoid butter, mayonnaise, shortening, or palm kernel oils.  Avoid foods with trans fats.  Drink skim or nonfat milk and eat low-fat or nonfat yogurt and cheeses. Avoid whole milk, cream, ice cream, egg yolks, and full-fat cheeses.  Healthier desserts include angel food cake, ginger snaps, animal crackers, hard candy, popsicles, and low-fat or nonfat frozen yogurt. Avoid pastries, cakes, pies, and cookies.  Exercise  Follow your exercise program as told by your health care provider. A regular program: ? Helps to decrease LDL and raise HDL. ? Helps with weight control.  Do things that increase your activity level, such as gardening, walking, and taking the stairs.  Ask your health care provider about ways that you can be more active in your daily life.  Medicine  Take over-the-counter and prescription medicines only as told by your health care provider. ? Medicine may be prescribed by your health care provider to help lower cholesterol and decrease the risk for heart disease. This is usually done if diet and exercise have failed to bring down cholesterol levels. ? If you have several risk factors, you may need medicine even if your levels are normal.  This information is not intended to replace advice given to you by your health care provider. Make sure you discuss any questions you have with your health care provider. Document Released: 09/12/2000 Document Revised: 07/16/2015 Document Reviewed: 06/18/2015 Elsevier Interactive Patient Education  [employer].  ---------------------------------------------------------------------------------------------  Prediabetes Prediabetes is the condition of having a blood sugar (blood glucose) level that is higher than it should be, but not high enough for you to be diagnosed with type 2 diabetes. Having prediabetes puts you at risk for developing type 2 diabetes (type 2 diabetes  mellitus). Prediabetes may be called impaired glucose tolerance or impaired fasting glucose. Prediabetes usually does not cause symptoms. Your health care provider can diagnose this condition with blood tests. You may be tested for prediabetes if you are overweight and if you have at least one other risk factor for prediabetes. Risk factors for prediabetes include:  Having a family member with type 2 diabetes.  Being overweight or obese.  Being older than age 43.  Being of American-Indian, African-American, Hispanic/Latino, or Asian/Pacific Islander descent.  Having an inactive (sedentary) lifestyle.  Having a history of gestational diabetes or polycystic ovarian syndrome (PCOS).  Having low levels of good cholesterol (HDL-C) or high levels of blood fats (triglycerides).  Having high blood pressure.  What is blood glucose and how is blood glucose measured?  Blood glucose refers to the amount of glucose in your bloodstream. Glucose comes from eating foods that contain sugars and starches (carbohydrates) that the body breaks down into glucose. Your blood glucose level may be measured in mg/dL (milligrams per deciliter) or mmol/L (millimoles per liter).Your blood glucose may be checked with one or more of the following blood tests:  A fasting blood glucose (FBG) test. You will not be allowed to eat (you will fast) for at least 8 hours before a blood sample is taken. ? A normal range for FBG is 70-100 mg/dl (1.1-9.1 mmol/L).  An A1c (hemoglobin A1c) blood test. This test provides information about blood glucose control over the previous 2?3months.  An oral glucose tolerance test (OGTT). This test measures your blood glucose twice: ? After fasting. This is your baseline level. ? Two hours after you drink a beverage that contains glucose.  You may be diagnosed with prediabetes:  If your FBG is 100?125 mg/dL (4.7-8.2 mmol/L).  If your A1c level is 5.7?6.4%.  If your OGGT result is  140?199 mg/dL (9.5-62 mmol/L).  These blood tests may be repeated to confirm your diagnosis. What happens if blood glucose is too high? The pancreas produces a hormone (insulin) that helps move glucose from the bloodstream into cells. When cells in the body do not respond properly to insulin that the body makes (insulin resistance), excess glucose builds up in the blood instead of going into cells. As a result, high blood glucose (hyperglycemia) can develop, which can cause many complications. This is a symptom of prediabetes. What can happen if blood glucose stays higher than normal for a long time? Having high blood glucose for a long time is dangerous. Too much glucose in your blood can damage your nerves and blood vessels. Long-term damage can lead to complications from diabetes, which may include:  Heart disease.  Stroke.  Blindness.  Kidney disease.  Depression.  Poor circulation in the feet and legs, which could lead to surgical removal (amputation) in severe cases.  How can prediabetes be prevented from turning into type 2 diabetes?  To help prevent type 2 diabetes, take the following actions:  Be physically active. ? Do moderate-intensity physical activity for at least 30 minutes on at least 5 days of the week, or as much as told by your health care provider. This could be brisk walking, biking, or water aerobics. ? Ask your health care provider  what activities are safe for you. A mix of physical activities may be best, such as walking, swimming, cycling, and strength training.  Lose weight as told by your health care provider. ? Losing 5-7% of your body weight can reverse insulin resistance. ? Your health care provider can determine how much weight loss is best for you and can help you lose weight safely.  Follow a healthy meal plan. This includes eating lean proteins, complex carbohydrates, fresh fruits and vegetables, low-fat dairy products, and healthy fats. ? Follow  instructions from your health care provider about eating or drinking restrictions. ? Make an appointment to see a diet and nutrition specialist (registered dietitian) to help you create a healthy eating plan that is right for you.  Do not smoke or use any tobacco products, such as cigarettes, chewing tobacco, and e-cigarettes. If you need help quitting, ask your health care provider.  Take over-the-counter and prescription medicines as told by your health care provider. You may be prescribed medicines that help lower the risk of type 2 diabetes.  This information is not intended to replace advice given to you by your health care provider. Make sure you discuss any questions you have with your health care provider. Document Released: 04/11/2015 Document Revised: 05/26/2015 Document Reviewed: 02/08/2015 Elsevier Interactive Patient Education  [employer].

## 2017-11-25 ENCOUNTER — Encounter: Payer: Self-pay | Admitting: Physician Assistant

## 2017-11-25 ENCOUNTER — Ambulatory Visit: Payer: Self-pay | Admitting: Physician Assistant

## 2017-11-25 ENCOUNTER — Other Ambulatory Visit: Payer: Self-pay | Admitting: Physician Assistant

## 2017-11-25 VITALS — BP 164/89 | HR 71 | Temp 97.5°F | Ht 68.5 in | Wt 180.5 lb

## 2017-11-25 DIAGNOSIS — M87059 Idiopathic aseptic necrosis of unspecified femur: Secondary | ICD-10-CM

## 2017-11-25 DIAGNOSIS — I10 Essential (primary) hypertension: Secondary | ICD-10-CM

## 2017-11-25 DIAGNOSIS — F17200 Nicotine dependence, unspecified, uncomplicated: Secondary | ICD-10-CM

## 2017-11-25 DIAGNOSIS — Z789 Other specified health status: Secondary | ICD-10-CM

## 2017-11-25 DIAGNOSIS — F1090 Alcohol use, unspecified, uncomplicated: Secondary | ICD-10-CM

## 2017-11-25 DIAGNOSIS — E785 Hyperlipidemia, unspecified: Secondary | ICD-10-CM

## 2017-11-25 MED ORDER — ATORVASTATIN CALCIUM 20 MG PO TABS: 20.0000 mg | ORAL_TABLET | Freq: Every day | ORAL | 1 refills | Status: DC

## 2017-11-25 MED ORDER — LISINOPRIL 40 MG PO TABS: 40.0000 mg | ORAL_TABLET | Freq: Every day | ORAL | 1 refills | Status: DC

## 2017-11-25 NOTE — Progress Notes (Signed)
   BP (!) 164/89 (BP Location: Right Arm, Patient Position: Sitting, Cuff Size: Normal)   Pulse 71   Temp (!) 97.5 F (36.4 C)   Ht 5' 8.5" (1.74 m)   Wt 180 lb 8 oz (81.9 kg)   SpO2 97%   BMI 27.05 kg/m    Subjective:    Patient ID: Cameron CompanionShane C Mckellar, male    DOB: 09-Sep-1965, 52 y.o.   MRN: [id number]  HPI: Cameron Flynn is a 52 y.o. male presenting on 11/25/2017 for Hypertension   HPI   Pt got his [facility] care application submitted. (he needs orthopedis for avascular necrosis)   Pt has cut his drinking to a 6pack each day.  He is still smoking.  Pt did not bring his meds with him today  Relevant past medical, surgical, family and social history reviewed and updated as indicated. Interim medical history since our last visit reviewed. Allergies and medications reviewed and updated.  CURRENT MEDS: unknown  Review of Systems  Per HPI unless specifically indicated above     Objective:    BP (!) 164/89 (BP Location: Right Arm, Patient Position: Sitting, Cuff Size: Normal)   Pulse 71   Temp (!) 97.5 F (36.4 C)   Ht 5' 8.5" (1.74 m)   Wt 180 lb 8 oz (81.9 kg)   SpO2 97%   BMI 27.05 kg/m   Wt Readings from Last 3 Encounters:  11/25/17 180 lb 8 oz (81.9 kg)  10/31/17 186 lb (84.4 kg)  10/21/17 185 lb (83.9 kg)    Physical Exam  Constitutional: He is oriented to person, place, and time. He appears well-developed and well-nourished.  HENT:  Head: Normocephalic and atraumatic.  Neck: Neck supple.  Cardiovascular: Normal rate and regular rhythm.  Pulmonary/Chest: Effort normal and breath sounds normal. He has no wheezes.  Abdominal: Soft. Bowel sounds are normal. There is no hepatosplenomegaly. There is no tenderness.  Musculoskeletal: He exhibits no edema.  Lymphadenopathy:    He has no cervical adenopathy.  Neurological: He is alert and oriented to person, place, and time.  Skin: Skin is warm and dry.  Psychiatric: He has a normal mood and affect. His  behavior is normal.  Vitals reviewed.       Assessment & Plan:   Encounter Diagnoses  Name Primary?  . Essential hypertension Yes  . Hyperlipidemia, unspecified hyperlipidemia type   . Tobacco use disorder   . Heavy alcohol use   . Avascular necrosis of bone of hip, unspecified laterality (HCC)      -pt Counseled to bring his meds to every appointment -Counseled smoking cessation and discussed need to cut back etoh to no more than 2 daily (although stopping altogether encouraged) -Will enter referral to orthopedist since pt has submitted his [facility] [employer] [facility] application -pt to follow up 6 wk.  RTO sooner prn.  Will update labs in January

## 2018-01-06 ENCOUNTER — Encounter: Payer: Self-pay | Admitting: Physician Assistant

## 2018-01-06 ENCOUNTER — Ambulatory Visit: Payer: Self-pay | Admitting: Physician Assistant

## 2018-01-06 VITALS — BP 162/84 | HR 68 | Temp 97.9°F | Ht 68.5 in | Wt 178.5 lb

## 2018-01-06 DIAGNOSIS — Z789 Other specified health status: Secondary | ICD-10-CM

## 2018-01-06 DIAGNOSIS — F17200 Nicotine dependence, unspecified, uncomplicated: Secondary | ICD-10-CM

## 2018-01-06 DIAGNOSIS — F1090 Alcohol use, unspecified, uncomplicated: Secondary | ICD-10-CM

## 2018-01-06 DIAGNOSIS — E785 Hyperlipidemia, unspecified: Secondary | ICD-10-CM

## 2018-01-06 DIAGNOSIS — M87059 Idiopathic aseptic necrosis of unspecified femur: Secondary | ICD-10-CM

## 2018-01-06 DIAGNOSIS — I10 Essential (primary) hypertension: Secondary | ICD-10-CM

## 2018-01-06 MED ORDER — AMLODIPINE BESYLATE 5 MG PO TABS: 5.0000 mg | ORAL_TABLET | Freq: Every day | ORAL | 3 refills | Status: DC

## 2018-01-06 NOTE — Progress Notes (Signed)
BP (!) 162/84 (BP Location: Right Arm, Patient Position: Sitting, Cuff Size: Normal)   Pulse 68   Temp 97.9 F (36.6 C)   Ht 5' 8.5" (1.74 m)   Wt 178 lb 8 oz (81 kg)   SpO2 98%   BMI 26.75 kg/m    Subjective:    Patient ID: Cameron Flynn, male    DOB: 1965-10-10, 53 y.o.   MRN: [id number]  HPI: Cameron Flynn is a 53 y.o. male presenting on 01/06/2018 for Hypertension   HPI   Pt still drinking a 6 pack/day He is smoking 1 ppd  Pt says he got approval letter from [facility]  He hasn't heard from orthopedics.  He was referred at [facility] in November.  Pt complains of a lot of pain in the hip.   Pt says he has been checking his bp at home some and it is high there as well. Pt did bring his medications in with him today.   Relevant past medical, surgical, family and social history reviewed and updated as indicated. Interim medical history since our last visit reviewed. Allergies and medications reviewed and updated.   Current Outpatient Medications:  .  atorvastatin (LIPITOR) 20 MG tablet, Take 1 tablet (20 mg total) by mouth daily., Disp: 90 tablet, Rfl: 1 .  ibuprofen (ADVIL,MOTRIN) 200 MG tablet, Take 400 mg by mouth every 12 (twelve) hours as needed., Disp: , Rfl:  .  lisinopril (PRINIVIL,ZESTRIL) 40 MG tablet, Take 1 tablet (40 mg total) by mouth daily., Disp: 90 tablet, Rfl: 1    Review of Systems  Constitutional: Negative for appetite change, chills, diaphoresis, fatigue, fever and unexpected weight change.  HENT: Positive for hearing loss. Negative for congestion, dental problem, drooling, ear pain, facial swelling, mouth sores, sneezing, sore throat, trouble swallowing and voice change.   Eyes: Negative for pain, discharge, redness, itching and visual disturbance.  Respiratory: Negative for cough, choking, shortness of breath and wheezing.   Cardiovascular: Negative for chest pain, palpitations and leg swelling.  Gastrointestinal: Negative for abdominal pain,  blood in stool, constipation, diarrhea and vomiting.  Endocrine: Negative for cold intolerance, heat intolerance and polydipsia.  Genitourinary: Negative for decreased urine volume, dysuria and hematuria.  Musculoskeletal: Negative for arthralgias, back pain and gait problem.  Skin: Negative for rash.  Allergic/Immunologic: Negative for environmental allergies.  Neurological: Negative for seizures, syncope, light-headedness and headaches.  Hematological: Negative for adenopathy.  Psychiatric/Behavioral: Negative for agitation, dysphoric mood and suicidal ideas. The patient is not nervous/anxious.     Per HPI unless specifically indicated above     Objective:    BP (!) 162/84 (BP Location: Right Arm, Patient Position: Sitting, Cuff Size: Normal)   Pulse 68   Temp 97.9 F (36.6 C)   Ht 5' 8.5" (1.74 m)   Wt 178 lb 8 oz (81 kg)   SpO2 98%   BMI 26.75 kg/m   Wt Readings from Last 3 Encounters:  01/06/18 178 lb 8 oz (81 kg)  11/25/17 180 lb 8 oz (81.9 kg)  10/31/17 186 lb (84.4 kg)    Physical Exam Vitals signs reviewed.  Constitutional:      Appearance: He is well-developed.  HENT:     Head: Normocephalic and atraumatic.  Neck:     Musculoskeletal: Neck supple.  Cardiovascular:     Rate and Rhythm: Normal rate and regular rhythm.  Pulmonary:     Effort: Pulmonary effort is normal.     Breath sounds: Normal  breath sounds. No wheezing.  Abdominal:     General: Bowel sounds are normal.     Palpations: Abdomen is soft.     Tenderness: There is no abdominal tenderness.  Lymphadenopathy:     Cervical: No cervical adenopathy.  Skin:    General: Skin is warm and dry.  Neurological:     Mental Status: He is alert and oriented to person, place, and time.  Psychiatric:        Behavior: Behavior normal.         Assessment & Plan:    Encounter Diagnoses  Name Primary?  . Essential hypertension Yes  . Hyperlipidemia, unspecified hyperlipidemia type   . Tobacco use  disorder   . Heavy alcohol use   . Avascular necrosis of bone of hip, unspecified laterality (HCC)     -Will check with orthopedics on referral -Add amlodipine for blood pressure -Pt to continue lisinopril and atorvastatin -Counseled smoking cessation  -Counseled to avoid drinking more than 2 drinks/daily -Follow up 6 weeks.  RTO sooner prn

## 2018-01-06 NOTE — Patient Instructions (Signed)
[facility] ([phone]) [phone]

## 2018-01-17 ENCOUNTER — Ambulatory Visit (INDEPENDENT_AMBULATORY_CARE_PROVIDER_SITE_OTHER): Payer: Self-pay | Admitting: Orthopedic Surgery

## 2018-01-17 ENCOUNTER — Encounter: Payer: Self-pay | Admitting: Orthopedic Surgery

## 2018-01-17 VITALS — BP 130/78 | HR 80 | Ht 68.0 in | Wt 180.0 lb

## 2018-01-17 DIAGNOSIS — M5137 Other intervertebral disc degeneration, lumbosacral region: Secondary | ICD-10-CM

## 2018-01-17 DIAGNOSIS — M87059 Idiopathic aseptic necrosis of unspecified femur: Secondary | ICD-10-CM

## 2018-01-17 DIAGNOSIS — F17219 Nicotine dependence, cigarettes, with unspecified nicotine-induced disorders: Secondary | ICD-10-CM

## 2018-01-17 NOTE — Patient Instructions (Addendum)
Total Hip Replacement  Total hip replacement is a surgery to replace your damaged hip joint. Your hip joint is replaced with a man-made (artificial) hip joint. This man-made hip joint is called a prosthesis. This surgery is done to lessen pain and to help your hip move better. What happens before the procedure? Staying hydrated Follow instructions from your doctor about drinking fluids. This may include:  Up to 2 hours before surgery - you may keep drinking clear liquids. These include: ? Water. ? Clear fruit juice. ? Black coffee. ? Plain tea. Eating and drinking restrictions Follow instructions from your doctor about eating and drinking. These may include:  8 hours before surgery - stop eating heavy meals or foods. These include meat, fried foods, and fatty foods.  6 hours before surgery - stop eating light meals or foods. These include toast and cereal.  6 hours before surgery - stop drinking milk or drinks that have milk in them.  2 hours before surgery - stop drinking clear liquids. Medicines Ask your doctor about:  Changing or stopping your normal medicines. This is important if you take diabetes medicines or blood thinners.  Taking medicines such as aspirin and ibuprofen. These can thin your blood. Do not take these medicines unless your doctor tells you to take them.  Taking over-the-counter medicines, vitamins, herbs, and supplements. General instructions  You may have a physical exam.  You may have tests, such as: ? X-rays or MRI. ? Blood or urine tests.  Plan to have someone take you home.  Plan to have someone you trust take care of you for at least 24 hours after you leave the hospital or clinic. This is important.  Prepare your home so you can be safe and have easy access to what you need.  Have teeth cleanings or any dental work done weeks before your surgery, or wait until a few weeks after your surgery.  Avoid shaving your legs just before surgery. If  any shaving is needed, it will be done in the hospital.  Ask your doctor how your surgical site will be marked or identified. What happens during the procedure?  To lower your risk of infection: ? Your health care team will wash or sanitize their hands. ? Hair may be removed from the surgical area. ? Your skin will be washed with soap.  An IV tube will be put into one of your veins.  You will be given one or more of the following: ? A medicine to help you relax (sedative). ? A medicine to make you fall asleep (general anesthetic). ? A medicine to numb your body below the waist (spinal anesthetic).  Your doctor will make a cut (incision) in your hip. The place where the cut is made will depend on the approach used by the doctor: ? Posterior approach. The cut will be at the back of the hip. ? Anterior approach. The cut will be at the front of the hip.  Then, your doctor will: ? Use his or her hands to move your hip out of position (dislocate it). ? Cut and take out damaged parts of your hip joint. ? Put a man-made hip joint into place. ? Do an X-ray of the hip joint to make sure it is in the right place. ? Place a drain to remove extra fluid, if needed. ? Close the cut and place a bandage (dressing) over it. The procedure may vary among doctors and hospitals. What happens after the procedure?  Your health care team will: ? Monitor you until you leave the hospital. ? Check your blood pressure, heart rate, breathing rate, and blood oxygen level. ? Check if you can move your foot and can feel sensations in it. ? Give you pain medicine.  Your doctor will tell you to take actions to help prevent blood clots and reduce swelling in your legs. You may need to: ? Wear a type of socks that are tight (compression stockings). ? Take medicines to thin your blood (anticoagulants).  You will do exercises (physical therapy) until you are doing well. Your doctor will tell you when you are well  enough to go home.  You may need to use a walker or crutches.  You may need to use a wedge pillow (hip abduction pillow) when you are in bed. This pillow will keep your legs from turning in ways that may cause your new hip joint to move out of place. Summary  Total hip replacement is a surgery to replace your damaged hip joint. Your hip joint is replaced with a man-made (artificial) hip joint.  Follow instructions from your doctor about eating and drinking before the procedure.  Plan to have someone take you home from the hospital.  You may need to use a walker or crutches after surgery. This information is not intended to replace advice given to you by your health care provider. Make sure you discuss any questions you have with your health care provider. Document Released: 03/12/2011 Document Revised: 01/29/2017 Document Reviewed: 01/29/2017 Elsevier Interactive Patient Education  2019 [employer]   Total Hip Replacement Total hip replacement is a surgical procedure to remove damaged bone in your hip joint and replace it with an artificial hip joint (prosthetic hip joint). The purpose of this surgery is to reduce pain and to improve your hip function. During a total hip replacement, one or both parts of the hip joint are replaced, depending on the type of joint damage you have. The hip is a ball-and-socket type of joint, and it has two main parts. The ball part of the joint (femoral head) is the top of the thigh bone (femur). The socket part of the joint is a large indent in the side of your pelvis (acetabulum) where the femur and pelvis meet. Tell a health care provider about:  Any allergies you have.  All medicines you are taking, including vitamins, herbs, eye drops, creams, and over-the-counter medicines.  Any problems you or family members have had with anesthetic medicines.  Any blood disorders you have.  Any surgeries you have had.  Any medical conditions you have. What  are the risks? Generally, total hip replacement is a safe procedure. However, problems can occur, including:  Infection.  Dislocation (the ball of the hip-joint prosthesis comes out of contact with the socket).  Loosening of the piece (stem) that connects the prosthetic femoral head to the femur.  Fracture of the bone while inserting the prosthesis.  Formation of blood clots, which can break loose and travel to and injure your lungs (pulmonary embolus).  What happens before the procedure?  Plan to have someone take you home after the procedure.  Do not eat or drink anything after midnight on the night before the procedure or as directed by your health care provider.  Ask your health care provider about: ? Changing or stopping your regular medicines. This is especially important if you are taking diabetes medicines or blood thinners. ? Taking medicines such as aspirin and  ibuprofen. These medicines can thin your blood. Do not take these medicines before your procedure if your health care provider asks you not to.  Ask your health care provider about how your surgical site will be marked or identified.  You may be given antibiotic medicines to help prevent infection. What happens during the procedure?  To reduce your risk of infection: ? Your health care team will wash or sanitize their hands. ? Your skin will be washed with soap.  An IV tube will be inserted into one of your veins. You will be given one or more of the following: ? A medicine that makes you drowsy (sedative). ? A medicine that makes you fall asleep (general anesthetic). ? A medicine injected into your spine that numbs your body below the waist (spinal anesthetic).  An incision will be made in your hip. Your surgeon will take out any damaged cartilage and bone.  Your surgeon will then: ? Insert a prosthetic socket into the acetabulum of your pelvis. This is usually secured with screws. ? Remove the femoral head  and replace it with a prosthetic ball and stem secured into the top of your femur. ? Place the ball into the socket and check the range of motion and stability of your new hip. ? Close the incision and apply a bandage over the surgical site. What happens after the procedure?  You will stay in a recovery area until the medicines have worn off.  Your vital signs, such as your pulse and blood pressure, will be monitored.  Once you are awake and stable, you will be taken to a hospital room.  You may be directed to take actions to help prevent blood clots. These may include: ? Walking soon after surgery, with someone assisting you. Moving around after surgery helps to improve blood flow. ? Taking medicines to thin your blood (anticoagulants). ? Wearing compression stockings or using different types of devices.  You will receive physical therapy until you are doing well and your health care provider feels it is safe for you to go home. This information is not intended to replace advice given to you by your health care provider. Make sure you discuss any questions you have with your health care provider. Document Released: 03/26/2000 Document Revised: 08/22/2015 Document Reviewed: 02/18/2013 Elsevier Interactive Patient Education  2018 [employer]. You have decided to proceed with hip replacement surgery. You have decided not to continue with nonoperative measures such as but not limited to oral medication, weight loss, activity modification, physical therapy, or injection.  We will perform the procedure commonly known as total hip replacement. Some of the risks associated with total hip replacement include but are not limited to Bleeding Infection Swelling Stiffness Blood clot Continued Pain Dislocation LEG LENGTH INEQUALITY REQUIRING A SHOE LIFT Persistent limp  Infection is a devastating complication of joint replacement surgery. If you get an infection the implant usually will have to  be removed and several surgeries and antibiotics will be needed to eradicate the infection. In some rare cases the hip cannot be reconstructed with another implant. This will lead to permanent need for crutches and assistive devices to ambulate.   In compliance with recent [place] law in federal regulation regarding opioid use and abuse and addiction, we will taper (stop) opioid medication after 6 weeks.   If you're not comfortable with these risks and would like to continue with nonoperative treatment please let Dr. Romeo Apple know prior to your surgery.

## 2018-01-17 NOTE — Progress Notes (Signed)
PREOP APPT   Chief Complaint  Patient presents with  . Hip Pain    53 year old male with a history of smoking history of previous heavy alcohol use presented to us in October of last year with bilateral AVN of the hip complaining of right hip pain.  He has a history of severe lumbar degenerative disc disease which is currently not symptomatic  He presents now after decreasing his smoking from 2-1/2 packs/day down to 1 pack/day ready for his right total hip replacement still complaining of right groin pain  As noted in the prior history  [id number] Male [facility]  He complains of right groin pain for the last 3 months no trauma.  The pain became so severe that he had to crawl around for several days.  The pain is been episodic comes and goes with increasing severity at times.  He has associated difficulty walking when the pain is severe.  No prior treatment.  History of hypertension 1 to 2 pack/day smoker     Review of Systems  Respiratory: Positive for cough.   Musculoskeletal: Positive for back pain.  Neurological: Negative for tingling, sensory change, focal weakness and weakness.  All other systems reviewed and are negative.    Past Medical History:  Diagnosis Date  . Hypertension     Past Surgical History:  Procedure Laterality Date  . FINGER SURGERY Right age 53   index    Family History  Problem Relation Age of Onset  . Cancer Father    Social History   Tobacco Use  . Smoking status: Current Every Day Smoker    Packs/day: 1.00    Years: 34.00    Pack years: 34.00    Types: Cigarettes  . Smokeless tobacco: Never Used  Substance Use Topics  . Alcohol use: Yes    Comment: 6 pack/day or more  . Drug use: Not Currently    Types: Marijuana    Comment: MJ as a teen    No Known Allergies  Current Meds  Medication Sig  . amLODipine (NORVASC) 5 MG tablet Take 1 tablet (5 mg total) by mouth daily.  Marland Kitchen.  atorvastatin (LIPITOR) 20 MG tablet Take 1 tablet (20 mg total) by mouth daily.  Marland Kitchen. ibuprofen (ADVIL,MOTRIN) 200 MG tablet Take 400 mg by mouth every 12 (twelve) hours as needed.  Marland Kitchen. lisinopril (PRINIVIL,ZESTRIL) 40 MG tablet Take 1 tablet (40 mg total) by mouth daily.    BP 130/78   Pulse 80   Ht 5\' 8"  (1.727 m)   Wt 180 lb (81.6 kg)   BMI 27.37 kg/m   Physical Exam Vitals signs reviewed.  Constitutional:      Appearance: Normal appearance. He is well-developed.  Skin:    General: Skin is warm and dry.     Findings: No erythema.  Neurological:     Mental Status: He is alert and oriented to person, place, and time.     Gait: Gait abnormal.  Psychiatric:        Mood and Affect: Mood and affect normal.     Right Hip Exam   Tenderness  Right hip tenderness location: Groin.  Range of Motion  Internal rotation: abnormal   Muscle Strength  The patient has normal right hip strength.  Tests  FABER: negative Ober: negative  Other  Erythema: absent Scars: absent Sensation: normal Pulse: present  Comments:  Normal but painful internal rotation right hip   Left  Hip Exam   Range of Motion  Internal rotation: abnormal   Muscle Strength  The patient has normal left hip strength.   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  . Avascular necrosis of femur, unspecified laterality (HCC) Yes  . DDD (degenerative disc disease), lumbosacral   . Cigarette nicotine dependence with nicotine-induced disorder     PLAN: (Rx., injectx, surgery, frx, mri/ct) RIGHT THA   The procedure has  been fully reviewed with the patient; The risks and benefits of surgery have been discussed and explained and understood. Alternative treatment has also been reviewed, questions were encouraged and answered. The postoperative plan is also been reviewed.   No orders of the defined types were placed in this encounter.   Fuller CanadaStanley , MD  01/17/2018 9:49 AM

## 2018-02-03 NOTE — Patient Instructions (Signed)
Cameron Flynn  02/03/2018     @PREFPERIOPPHARMACY @   Your procedure is scheduled on  02/11/2018  Report to [facility] at  615  A.M.  Call this number if you have problems the morning of surgery:  [phone]   Remember:  Do not eat or drink after midnight.                          Take these medicines the morning of surgery with A SIP OF WATER amlodipine, lisinopril.    Do not wear jewelry, make-up or nail polish.  Do not wear lotions, powders, or perfumes, or deodorant.  Do not shave 48 hours prior to surgery.  Men may shave face and neck.  Do not bring valuables to the hospital.  [facility] belongings or valuables.  Contacts, dentures or bridgework may not be worn into surgery.  Leave your suitcase in the car.  After surgery it may be brought to your room.  For patients admitted to the hospital, discharge time will be determined by your treatment team.  Patients discharged the day of surgery will not be allowed to drive home.   Name and phone number of your driver:   family Special instructions:  None  Please read over the following fact sheets that you were given. Pain Booklet, Coughing and Deep Breathing, Blood Transfusion Information, Lab Information, Total Joint Packet, MRSA Information, Surgical Site Infection Prevention, Anesthesia Post-op Instructions and Care and Recovery After Surgery       Total Hip Replacement  Total hip replacement is a surgery to replace your damaged hip joint. Your hip joint is replaced with a man-made (artificial) hip joint. This man-made hip joint is called a prosthesis. This surgery is done to lessen pain and to help your hip move better. What happens before the procedure? Staying hydrated Follow instructions from your doctor about drinking fluids. This may include:  Up to 2 hours before surgery - you may keep drinking clear liquids. These include: ? Water. ? Clear fruit  juice. ? Black coffee. ? Plain tea. Eating and drinking restrictions Follow instructions from your doctor about eating and drinking. These may include:  8 hours before surgery - stop eating heavy meals or foods. These include meat, fried foods, and fatty foods.  6 hours before surgery - stop eating light meals or foods. These include toast and cereal.  6 hours before surgery - stop drinking milk or drinks that have milk in them.  2 hours before surgery - stop drinking clear liquids. Medicines Ask your doctor about:  Changing or stopping your normal medicines. This is important if you take diabetes medicines or blood thinners.  Taking medicines such as aspirin and ibuprofen. These can thin your blood. Do not take these medicines unless your doctor tells you to take them.  Taking over-the-counter medicines, vitamins, herbs, and supplements. General instructions  You may have a physical exam.  You may have tests, such as: ? X-rays or MRI. ? Blood or urine tests.  Plan to have someone take you home.  Plan to have someone you trust take care of you for at least 24 hours after you leave the hospital or clinic. This is important.  Prepare your home so you can be safe and have easy access to what you need.  Have teeth cleanings or any dental work  done weeks before your surgery, or wait until a few weeks after your surgery.  Avoid shaving your legs just before surgery. If any shaving is needed, it will be done in the hospital.  Ask your doctor how your surgical site will be marked or identified. What happens during the procedure?  To lower your risk of infection: ? Your health care team will wash or sanitize their hands. ? Hair may be removed from the surgical area. ? Your skin will be washed with soap.  An IV tube will be put into one of your veins.  You will be given one or more of the following: ? A medicine to help you relax (sedative). ? A medicine to make you fall  asleep (general anesthetic). ? A medicine to numb your body below the waist (spinal anesthetic).  Your doctor will make a cut (incision) in your hip. The place where the cut is made will depend on the approach used by the doctor: ? Posterior approach. The cut will be at the back of the hip. ? Anterior approach. The cut will be at the front of the hip.  Then, your doctor will: ? Use his or her hands to move your hip out of position (dislocate it). ? Cut and take out damaged parts of your hip joint. ? Put a man-made hip joint into place. ? Do an X-ray of the hip joint to make sure it is in the right place. ? Place a drain to remove extra fluid, if needed. ? Close the cut and place a bandage (dressing) over it. The procedure may vary among doctors and hospitals. What happens after the procedure?  Your health care team will: ? Monitor you until you leave the hospital. ? Check your blood pressure, heart rate, breathing rate, and blood oxygen level. ? Check if you can move your foot and can feel sensations in it. ? Give you pain medicine.  Your doctor will tell you to take actions to help prevent blood clots and reduce swelling in your legs. You may need to: ? Wear a type of socks that are tight (compression stockings). ? Take medicines to thin your blood (anticoagulants).  You will do exercises (physical therapy) until you are doing well. Your doctor will tell you when you are well enough to go home.  You may need to use a walker or crutches.  You may need to use a wedge pillow (hip abduction pillow) when you are in bed. This pillow will keep your legs from turning in ways that may cause your new hip joint to move out of place. Summary  Total hip replacement is a surgery to replace your damaged hip joint. Your hip joint is replaced with a man-made (artificial) hip joint.  Follow instructions from your doctor about eating and drinking before the procedure.  Plan to have someone take  you home from the hospital.  You may need to use a walker or crutches after surgery. This information is not intended to replace advice given to you by your health care provider. Make sure you discuss any questions you have with your health care provider. Document Released: 03/12/2011 Document Revised: 01/29/2017 Document Reviewed: 01/29/2017 Elsevier Interactive Patient Education  2019 [employer]  Total Hip Replacement, Posterior, Care After This sheet gives you information about how to care for yourself after your procedure. Your doctor may also give you more specific instructions. If you have problems or questions, contact your doctor. What can I expect after the  procedure? After the procedure, it is common to have:  Pain and swelling.  A small amount of blood or clear fluid coming from the cut that was made during surgery (incision). Follow these instructions at home: Medicines  Take over-the-counter and prescription medicines only as told by your doctor.  If you were prescribed a medicine to thin your blood (anticoagulant), take it as told by your doctor. Activity  Ask your doctor what activities are safe for you.  Do exercises as told by your doctor or physical therapist.  Rest often, but move around as much as you can. Movement helps you heal and helps prevent problems.  Do not use your legs to support (bear) your body weight until your doctor says it is okay. Follow instructions about how much weight you may safely support on your affected leg (weight-bearing restrictions).  Use crutches or a walker as told by your doctor. Movement restrictions   To keep from dislocating your hip, follow instructions about movement restrictions as told by your doctor. For example: 1. Do not cross your legs at the knees. To remind yourself about this, you may keep a pillow between your legs while lying in bed. 2. Do not bend farther than 90 degrees at the hip and waist. To keep from  bending this far:  Do not bring your knees higher than your hips.  Do not pick up something from the floor while sitting in a chair.  Avoid sitting in low chairs.  Use a raised toilet seat.  When standing up from a seated position, keep the injured leg out in front of you. 3. Avoid twisting at your waist and reaching across your body to the side of the affected leg. 4. Avoid rotating your toes inward on the affected leg.  When getting into a car: 1. Raise the seat as high as you can. 2. Move the seat as far back as it will go. 3. Recline the upper part of the seat slightly. 4. Sit down into the seat with your injured leg extended out of the car. 5. Scoot back in the seat as you move the lower half of your body into the car. Try to avoid bumping your foot or leg as you bring it into the car. To make this motion smooth and easy on a cloth seat, try placing a plastic bag on the seat. Surgery cut care   Follow instructions from your doctor about how to take care of your cut from surgery. Make sure you: ? Wash your hands with soap and water before you change your bandage (dressing). If you cannot use soap and water, use alcohol-based hand sanitizer. ? Change your bandage as told by your doctor. ? Leave stitches (sutures), skin glue, or skin tape (adhesive) strips in place. They may need to stay in place for 2 weeks or longer. If tape strips get loose and curl up, you may trim the loose edges. Do not remove tape strips completely unless your doctor tells you to do that.  Do not take baths, swim, or use a hot tub until your doctor says it is okay.  Check your surgical cut every day for signs of infection. Check for: ? More redness, swelling, or pain. ? More fluid or blood. ? Warmth. ? Pus or a bad smell. Managing pain, stiffness, and swelling   If directed, put ice on the hip area. ? Put ice in a plastic bag. ? Place a towel between your skin and the bag. ?  Leave the ice on for 20  minutes, 2-3 times a day.  Move your toes often to avoid stiffness and to lessen swelling.  Raise (elevate) your leg above the level of your heart while you are sitting or lying down. Driving  Ask your doctor when it is safe to drive. This may not be recommended for up to 6 weeks after surgery.  Do not drive or use heavy machinery while taking prescription pain medicine. General instructions  Do not use any products that have nicotine or tobacco in them, such as cigarettes and e-cigarettes. These can delay bone healing. If you need help quitting, ask your doctor.  Tell your doctor before you have any dental procedures done, including teeth cleaning.  Wear compression stockings as told by your doctor. These help to prevent blood clots and reduce swelling in your legs.  If you are taking prescription pain medicine, take actions to prevent or treat constipation. Your doctor may recommend that you: ? Drink enough fluid to keep your pee (urine) pale yellow. ? Eat foods that are high in fiber. This includes fresh fruits and vegetables, whole grains, and beans. ? Limit foods that are high in fat and processed sugars, such as fried or sweet foods. ? Take an over-the-counter or prescription medicine for constipation.  Keep all follow-up visits as told by your doctor. This is important. Contact a doctor if:  You have trouble breathing.  You have either of these at your surgery site: ? Any signs of infection. ? Bleeding that will not stop.  Your surgical cut opens up after stitches or staples are taken out.  You have a fever. Get help right away if:  You have more redness, swelling, or pain in the leg you had surgery on.  You have pain or swelling in your thigh or on the back of your lower leg.  You have shortness of breath.  You have chest pain.  You have pain that is not helped by pain medicine. Summary  Do not use your legs to support your body weight until your doctor says  it is okay. Use crutches or a walker as told.  To keep from dislocating your hip, follow instructions about movement restrictions as told.  If you were prescribed a medicine to thin your blood (anticoagulant), take it as told by your doctor.  Keep all follow-up visits as told by your doctor. This is important. This information is not intended to replace advice given to you by your health care provider. Make sure you discuss any questions you have with your health care provider. Document Released: 03/12/2011 Document Revised: 01/29/2017 Document Reviewed: 01/29/2017 Elsevier Interactive Patient Education  2019 [employer]  Spinal Anesthesia and Epidural Anesthesia Spinal anesthesia and epidural anesthesia are methods of numbing the body. They are done by injecting numbing medicine (anesthetic) into the back, near the spinal cord. Spinal anesthesia is usually done to numb an area at and below the place where the injection is made. It is often used during surgeries of the pelvis, hips, legs, and lower abdomen. It begins to work almost immediately. Epidural anesthesia may be done to numb an area above or below the area where the injection is made. It is often used during childbirth and after major abdominal or chest surgery. It begins to work after 10-20 minutes. Tell a health care provider about:  Any allergies you have.  All medicines you are taking, including vitamins, herbs, eye drops, creams, and over-the-counter medicines.  Any problems  you or family members have had with anesthetic medicines.  Any blood disorders you have.  Any surgeries you have had.  Any medical conditions you have.  Whether you are pregnant or may be pregnant.  Any recent alcohol, tobacco, or drug use. What are the risks? Generally, this is a safe procedure. However, problems may occur, including:  Headache.  A drop in blood pressure. In some cases, this can lead to a heart attack or a stroke.  Nerve  damage.  Infection.  Allergic reaction to medicines.  Seizures.  Spinal fluid leak.  Bleeding around the injection site.  Inability to breathe. If this happens, a tube may be put into your windpipe (trachea) and a machine may be used to help you breathe until the anesthetic wears off.  Long-lasting numbness, pain, or loss of function of body parts.  Nausea and vomiting.  Dizziness and fainting.  Shivering.  Itching. What happens before the procedure? Staying hydrated Follow instructions from your health care provider about hydration, which may include:  Up to 2 hours before the procedure - you may continue to drink clear liquids, such as water, clear fruit juice, black coffee, and plain tea.  Eating and drinking restrictions Follow instructions from your health care provider about eating and drinking, which may include:  8 hours before the procedure - stop eating heavy meals or foods such as meat, fried foods, or fatty foods.  6 hours before the procedure - stop eating light meals or foods, such as toast or cereal.  6 hours before the procedure - stop drinking milk or drinks that contain milk.  2 hours before the procedure - stop drinking clear liquids. Medicine Ask your health care provider about:  Changing or stopping your regular medicines. This is especially important if you are taking diabetes medicines or blood thinners.  Changing or stopping your dietary supplements.  Taking medicines such as aspirin and ibuprofen. These medicines can thin your blood. Do not take these medicines before your procedure if your health care provider instructs you not to. General instructions   Do not use any tobacco products, such as cigarettes, chewing tobacco, and electronic cigarettes, for as long as possible before your procedure. If you need help quitting, ask your health care provider.  If you use a sleep apnea device, ask your health care provider whether you should bring  it with you on the day of your surgery.  Ask your health care provider if you will have to stay overnight at the hospital or clinic.  If you will not have to stay overnight: ? Plan to have someone take you home from the hospital or clinic. ? Plan to have a responsible adult care for you for at least 24 hours after you leave the hospital or clinic. This is important. What happens during the procedure?   A health care provider will put patches on your chest, a cuff around your arm, or a sensor device on your finger. These will be attached to monitors that allow your health care provider to watch your blood pressure, pulse, and oxygen levels to make sure that the anesthetic does not cause any problems.  An IV tube may be inserted into one of your veins. The tube will be used to give you fluids and medicines throughout the procedure as needed.  You may be given a medicine to help you relax (sedative).  You will be asked to sit up or to lie on your side with your knees and your  chin bent toward your chest. These positions open up the space between the bones in your back, making it easier to inject the medicine.  The area of your back where the medicine will be injected will be cleaned.  A medicine called a local anesthetic may be injected to numb the area where the spinal or epidural anesthetic will be injected.  A needle will be inserted between the bones of your back. While this is being done: ? Continue to breathe normally. ? Stay as still and quiet as you can. ? If you feel a tingling shock or pain going down your leg, tell your health care provider but try not to move.  The spinal or epidural anesthetic will be injected.  If you receive an epidural anesthetic and need more than one dose, a tiny, flexible tube (catheter) will be placed where the anesthetic was injected. Additional doses will be given through the catheter. If you need pain medicine after the procedure, the catheter will be  kept in place.  If an epidural catheter has not been placed in your injection site or left there, a small bandage (dressing) will be placed over the injection site. The procedure may vary among health care providers and hospitals. What happens after the procedure?  You will need to stay in bed until your health care provider says it safe for you to walk.  Your blood pressure, heart rate, breathing rate, and blood oxygen level will be monitored until the medicines you were given have worn off.  If you have a catheter, it will be removed when it is no longer needed.  Do not drive for 24 hours if you received a sedative.  It is common to have nausea and itching. There are medicines that can help with these side effects. It is also common to have: ? Sleepiness. ? Vomiting. ? Numbness or tingling in your legs. ? Trouble urinating. Summary  Spinal anesthesia and epidural anesthesia are methods of numbing the body.  Tell your health care provider about any allergies or medical problems you have, any problems you have had with anesthetic medicines, any blood disorders you have, and whether you are or may be pregnant.  If you use a sleep apnea device, ask your health care provider whether you should bring it with you on the day of your surgery.  The spinal or epidural anesthetic will be injected through the space between the bones in your back.  Do not drive for 24 hours if you received a sedative. This information is not intended to replace advice given to you by your health care provider. Make sure you discuss any questions you have with your health care provider. Document Released: 03/10/2003 Document Revised: 08/01/2016 Document Reviewed: 04/11/2015 Elsevier Interactive Patient Education  2019 [employer]  Spinal Anesthesia and Epidural Anesthesia, Care After This sheet gives you information about how to care for yourself after your procedure. Your doctor may also give you more  specific instructions. If you have problems or questions, call your doctor. Follow these instructions at home: For at least 24 hours after the procedure:   Have a responsible adult stay with you. It is important to have someone help care for you until you are awake and alert.  Rest as needed.  Do not do activities where you could fall or get hurt (injured).  Do not drive.  Do not use heavy machinery.  Do not drink alcohol.  Do not take sleeping pills or medicines that make you  sleepy (drowsy).  Do not make important decisions.  Do not sign legal documents.  Do not take care of children on your own. Eating and drinking  If you throw up (vomit), drink water, juice, or soup when nausea and vomiting stop.  Drink enough fluid to keep your pee (urine) pale yellow.  Make sure you do not feel like throwing up (nauseous) before you eat solid foods.  Follow the diet that your doctor recommends. General instructions  Return to your normal activities as told by your doctor. Ask your doctor what activities are safe for you.  Take over-the-counter and prescription medicines only as told by your doctor.  If you have sleep apnea, surgery and certain medicines can raise your risk for breathing problems. Follow instructions from your doctor about when to wear your sleep device. Your doctor may tell you to wear your sleep device: ? Anytime you are sleeping, including during daytime naps. ? While taking prescription pain medicines, sleeping pills, or medicines that make you sleepy.  Do not use any products that contain nicotine or tobacco. This includes cigarettes and e-cigarettes. ? If you need help quitting, ask your doctor. ? If you smoke, do not smoke by yourself. Make sure someone is nearby in case you need help.  Keep all follow-up visits as told by your doctor. This is important. Contact a doctor if:  It has been more than one day since your procedure and you feel like throwing  up.  It has been more than one day since your procedure and you throw up.  You have a rash. Get help right away if:  You have a fever.  You have a headache that lasts a long time.  You have a very bad headache.  Your vision is blurry.  You see two of a single object (double vision).  You are dizzy or light-headed.  You faint.  Your arms or legs tingle, feel weak, or get numb.  You have trouble breathing.  You cannot pee (urinate). Summary  After the procedure, have a responsible adult stay with you at home until you are fully awake and alert.  Do not do activities that might get you injured. Do not drive, use heavy machinery, drink alcohol, or make important decisions for 24 hours after the procedure.  Take medicines as told by your doctor. Do not use products that contain nicotine or tobacco.  Get help right away if you have a fever, blurry vision, difficulty breathing or passing urine, or weakness or numbness in arms or legs. This information is not intended to replace advice given to you by your health care provider. Make sure you discuss any questions you have with your health care provider. Document Released: 04/11/2015 Document Revised: 08/01/2016 Document Reviewed: 04/11/2015 Elsevier Interactive Patient Education  2019 [employer]  General Anesthesia, Adult General anesthesia is the use of medicines to make a person "go to sleep" (unconscious) for a medical procedure. General anesthesia must be used for certain procedures, and is often recommended for procedures that:  Last a long time.  Require you to be still or in an unusual position.  Are major and can cause blood loss. The medicines used for general anesthesia are called general anesthetics. As well as making you unconscious for a certain amount of time, these medicines:  Prevent pain.  Control your blood pressure.  Relax your muscles. Tell a health care provider about:  Any allergies you  have.  All medicines you are taking, including vitamins,  herbs, eye drops, creams, and over-the-counter medicines.  Any problems you or family members have had with anesthetic medicines.  Types of anesthetics you have had in the past.  Any blood disorders you have.  Any surgeries you have had.  Any medical conditions you have.  Any recent upper respiratory, chest, or ear infections.  Any history of: ? Heart or lung conditions, such as heart failure, sleep apnea, asthma, or chronic obstructive pulmonary disease (COPD). ? Financial planner. ? Depression or anxiety.  Any tobacco or drug use, including marijuana or alcohol use.  Whether you are pregnant or may be pregnant. What are the risks? Generally, this is a safe procedure. However, problems may occur, including:  Allergic reaction.  Lung and heart problems.  Inhaling food or liquid from the stomach into the lungs (aspiration).  Nerve injury.  Dental injury.  Air in the bloodstream, which can lead to stroke.  Extreme agitation or confusion (delirium) when you wake up from the anesthetic.  Waking up during your procedure and being unable to move. This is rare. These problems are more likely to develop if you are having a major surgery or if you have an advanced or serious medical condition. You can prevent some of these complications by answering all of your health care provider's questions thoroughly and by following all instructions before your procedure. General anesthesia can cause side effects, including:  Nausea or vomiting.  A sore throat from the breathing tube.  Hoarseness.  Wheezing or coughing.  Shaking chills.  Tiredness.  Body aches.  Anxiety.  Sleepiness or drowsiness.  Confusion or agitation. What happens before the procedure? Staying hydrated Follow instructions from your health care provider about hydration, which may include:  Up to 2 hours before the procedure - you may continue to  drink clear liquids, such as water, clear fruit juice, black coffee, and plain tea.  Eating and drinking restrictions Follow instructions from your health care provider about eating and drinking, which may include:  8 hours before the procedure - stop eating heavy meals or foods such as meat, fried foods, or fatty foods.  6 hours before the procedure - stop eating light meals or foods, such as toast or cereal.  6 hours before the procedure - stop drinking milk or drinks that contain milk.  2 hours before the procedure - stop drinking clear liquids. Medicines Ask your health care provider about:  Changing or stopping your regular medicines. This is especially important if you are taking diabetes medicines or blood thinners.  Taking medicines such as aspirin and ibuprofen. These medicines can thin your blood. Do not take these medicines unless your health care provider tells you to take them.  Taking over-the-counter medicines, vitamins, herbs, and supplements. Do not take these during the week before your procedure unless your health care provider approves them. General instructions  Starting 3-6 weeks before the procedure, do not use any products that contain nicotine or tobacco, such as cigarettes and e-cigarettes. If you need help quitting, ask your health care provider.  If you brush your teeth on the morning of the procedure, make sure to spit out all of the toothpaste.  Tell your health care provider if you become ill or develop a cold, cough, or fever.  If instructed by your health care provider, bring your sleep apnea device with you on the day of your surgery (if applicable).  Ask your health care provider if you will be going home the same day, the following day,  or after a longer hospital stay. ? Plan to have someone take you home from the hospital or clinic. ? Plan to have a responsible adult care for you for at least 24 hours after you leave the hospital or clinic. This is  important. What happens during the procedure?   You will be given anesthetics through both of the following: ? A mask placed over your nose and mouth. ? An IV in one of your veins.  You may receive a medicine to help you relax (sedative).  After you are unconscious, a breathing tube may be inserted down your throat to help you breathe. This will be removed before you wake up.  An anesthesia specialist will stay with you throughout your procedure. He or she will: ? Keep you comfortable and safe by continuing to give you medicines and adjusting the amount of medicine that you get. ? Monitor your blood pressure, pulse, and oxygen levels to make sure that the anesthetics do not cause any problems. The procedure may vary among health care providers and hospitals. What happens after the procedure?  Your blood pressure, temperature, heart rate, breathing rate, and blood oxygen level will be monitored until the medicines you were given have worn off.  You will wake up in a recovery area. You may wake up slowly.  If you feel anxious or agitated, you may be given medicine to help you calm down.  If you will be going home the same day, your health care provider may check to make sure you can walk, drink, and urinate.  Your health care provider will treat any pain or side effects you have before you go home.  Do not drive for 24 hours if you were given a sedative. Summary  General anesthesia is used to keep you still and prevent pain during a procedure.  It is important to tell your health care provider about your medical history and any surgeries you have had, and previous experience with anesthesia.  Follow your health care provider's instructions about when to stop eating, drinking, or taking certain medicines before your procedure.  Plan to have someone take you home from the hospital or clinic. This information is not intended to replace advice given to you by your health care  provider. Make sure you discuss any questions you have with your health care provider. Document Released: 03/27/2007 Document Revised: 05/07/2017 Document Reviewed: 08/03/2016 Elsevier Interactive Patient Education  2019 [employer] General Anesthesia, Adult, Care After This sheet gives you information about how to care for yourself after your procedure. Your health care provider may also give you more specific instructions. If you have problems or questions, contact your health care provider. What can I expect after the procedure? After the procedure, the following side effects are common:  Pain or discomfort at the IV site.  Nausea.  Vomiting.  Sore throat.  Trouble concentrating.  Feeling cold or chills.  Weak or tired.  Sleepiness and fatigue.  Soreness and body aches. These side effects can affect parts of the body that were not involved in surgery. Follow these instructions at home:  For at least 24 hours after the procedure:  Have a responsible adult stay with you. It is important to have someone help care for you until you are awake and alert.  Rest as needed.  Do not: ? Participate in activities in which you could fall or become injured. ? Drive. ? Use heavy machinery. ? Drink alcohol. ? Take sleeping pills or medicines  that cause drowsiness. ? Make important decisions or sign legal documents. ? Take care of children on your own. Eating and drinking  Follow any instructions from your health care provider about eating or drinking restrictions.  When you feel hungry, start by eating small amounts of foods that are soft and easy to digest (bland), such as toast. Gradually return to your regular diet.  Drink enough fluid to keep your urine pale yellow.  If you vomit, rehydrate by drinking water, juice, or clear broth. General instructions  If you have sleep apnea, surgery and certain medicines can increase your risk for breathing problems. Follow  instructions from your health care provider about wearing your sleep device: ? Anytime you are sleeping, including during daytime naps. ? While taking prescription pain medicines, sleeping medicines, or medicines that make you drowsy.  Return to your normal activities as told by your health care provider. Ask your health care provider what activities are safe for you.  Take over-the-counter and prescription medicines only as told by your health care provider.  If you smoke, do not smoke without supervision.  Keep all follow-up visits as told by your health care provider. This is important. Contact a health care provider if:  You have nausea or vomiting that does not get better with medicine.  You cannot eat or drink without vomiting.  You have pain that does not get better with medicine.  You are unable to pass urine.  You develop a skin rash.  You have a fever.  You have redness around your IV site that gets worse. Get help right away if:  You have difficulty breathing.  You have chest pain.  You have blood in your urine or stool, or you vomit blood. Summary  After the procedure, it is common to have a sore throat or nausea. It is also common to feel tired.  Have a responsible adult stay with you for the first 24 hours after general anesthesia. It is important to have someone help care for you until you are awake and alert.  When you feel hungry, start by eating small amounts of foods that are soft and easy to digest (bland), such as toast. Gradually return to your regular diet.  Drink enough fluid to keep your urine pale yellow.  Return to your normal activities as told by your health care provider. Ask your health care provider what activities are safe for you. This information is not intended to replace advice given to you by your health care provider. Make sure you discuss any questions you have with your health care provider. Document Released: 03/26/2000 Document  Revised: 08/03/2016 Document Reviewed: 08/03/2016 Elsevier Interactive Patient Education  2019 [employer].

## 2018-02-07 ENCOUNTER — Other Ambulatory Visit: Payer: Self-pay

## 2018-02-07 ENCOUNTER — Encounter (HOSPITAL_COMMUNITY): Payer: Self-pay

## 2018-02-07 ENCOUNTER — Encounter (HOSPITAL_COMMUNITY)
Admission: RE | Admit: 2018-02-07 | Discharge: 2018-02-07 | Disposition: A | Discharge status: Home / Self Care | Payer: Self-pay | Source: Ambulatory Visit | Attending: Orthopedic Surgery | Admitting: Orthopedic Surgery

## 2018-02-07 DIAGNOSIS — Z01812 Encounter for preprocedural laboratory examination: Secondary | ICD-10-CM | POA: Insufficient documentation

## 2018-02-07 HISTORY — DX: Pure hypercholesterolemia, unspecified: E78.00

## 2018-02-07 HISTORY — DX: Unspecified hearing loss, unspecified ear: H91.90

## 2018-02-07 LAB — BASIC METABOLIC PANEL
Anion gap: 10 (ref 5–15)
BUN: 11 mg/dL (ref 6–20)
CO2: 20 mmol/L — ABNORMAL LOW (ref 22–32)
Calcium: 8.8 mg/dL — ABNORMAL LOW (ref 8.9–10.3)
Chloride: 104 mmol/L (ref 98–111)
Creatinine, Ser: 0.66 mg/dL (ref 0.61–1.24)
GFR calc Af Amer: >60 mL/min (ref >60)
GLUCOSE: 96 mg/dL (ref 70–99)
Potassium: 4.5 mmol/L (ref 3.5–5.1)
Sodium: 134 mmol/L — ABNORMAL LOW (ref 135–145)

## 2018-02-07 LAB — PREPARE RBC (CROSSMATCH)

## 2018-02-07 LAB — CBC WITH DIFFERENTIAL/PLATELET
ABS IMMATURE GRANULOCYTES: 0.03 10*3/uL (ref 0.00–0.07)
BASOS PCT: 1 %
Basophils Absolute: 0.1 10*3/uL (ref 0.0–0.1)
Eosinophils Absolute: 0.2 10*3/uL (ref 0.0–0.5)
Eosinophils Relative: 2 %
HCT: 43.8 % (ref 39.0–52.0)
Hemoglobin: 14.7 g/dL (ref 13.0–17.0)
Immature Granulocytes: 0 %
Lymphocytes Relative: 16 %
Lymphs Abs: 1.6 10*3/uL (ref 0.7–4.0)
MCH: 33.3 pg (ref 26.0–34.0)
MCHC: 33.6 g/dL (ref 30.0–36.0)
MCV: 99.3 fL (ref 80.0–100.0)
MONOS PCT: 8 %
Monocytes Absolute: 0.8 10*3/uL (ref 0.1–1.0)
Neutro Abs: 7.1 10*3/uL (ref 1.7–7.7)
Neutrophils Relative %: 73 %
PLATELETS: 285 10*3/uL (ref 150–400)
RBC: 4.41 MIL/uL (ref 4.22–5.81)
RDW: 13.2 % (ref 11.5–15.5)
WBC: 9.8 10*3/uL (ref 4.0–10.5)
nRBC: 0 % (ref 0.0–0.2)

## 2018-02-07 LAB — SURGICAL PCR SCREEN
MRSA, PCR: NEGATIVE
Staphylococcus aureus: NEGATIVE

## 2018-02-07 LAB — TYPE AND SCREEN
ABO/RH(D): O NEG
Antibody Screen: NEGATIVE

## 2018-02-07 LAB — ABO/RH: ABO/RH(D): O NEG

## 2018-02-10 NOTE — H&P (Signed)
PREOP APPT       Chief Complaint  Patient presents with  . Hip Pain      53 year old male with a history of smoking history of previous heavy alcohol use presented to [place] in October of last year with bilateral AVN of the hip complaining of right hip pain.  He has a history of severe lumbar degenerative disc disease which is currently not symptomatic   He presents now after decreasing his smoking from 2-1/2 packs/day down to 1 pack/day ready for his right total hip replacement still complaining of right groin pain   As noted in the prior history  79 Male presents [facility]   He complains of right groin pain for the last 3 months no trauma.  The pain became so severe that he had to crawl around for several days.  The pain is been episodic comes and goes with increasing severity at times.  He has associated difficulty walking when the pain is severe.  No prior treatment.  History of hypertension 1 to 2 pack/day smoker    Review of Systems  Respiratory: Positive for cough.   Musculoskeletal: Positive for back pain.  Neurological: Negative for tingling, sensory change, focal weakness and weakness.  All other systems reviewed and are negative.         Past Medical History:  Diagnosis Date  . Hypertension             Past Surgical History:  Procedure Laterality Date  . FINGER SURGERY Right age 77    index      Family History  Problem Relation Age of Onset  . Cancer Father      Social History         Tobacco Use  . Smoking status: Current Every Day Smoker      Packs/day: 1.00      Years: 34.00      Pack years: 34.00      Types: Cigarettes  . Smokeless tobacco: Never Used  Substance Use Topics  . Alcohol use: Yes      Comment: 6 pack/day or more  . Drug use: Not Currently      Types: Marijuana      Comment: MJ as a teen      No Known Allergies   Active Medications      Current Meds  Medication Sig  . amLODipine  (NORVASC) 5 MG tablet Take 1 tablet (5 mg total) by mouth daily.  Marland Kitchen atorvastatin (LIPITOR) 20 MG tablet Take 1 tablet (20 mg total) by mouth daily.  Marland Kitchen ibuprofen (ADVIL,MOTRIN) 200 MG tablet Take 400 mg by mouth every 12 (twelve) hours as needed.  Marland Kitchen lisinopril (PRINIVIL,ZESTRIL) 40 MG tablet Take 1 tablet (40 mg total) by mouth daily.        BP 130/78   Pulse 80   Ht 5\' 8"  (1.727 m)   Wt 180 lb (81.6 kg)   BMI 27.37 kg/m    Physical Exam Vitals signs reviewed.  Constitutional:      Appearance: Normal appearance. He is well-developed.  Skin:    General: Skin is warm and dry.     Findings: No erythema.  Neurological:     Mental Status: He is alert and oriented to person, place, and time.     Gait: Gait abnormal.  Psychiatric:        Mood and Affect: Mood and affect normal.  Right Hip Exam    Tenderness  Right hip tenderness location: Groin.   Range of Motion  Internal rotation: abnormal    Muscle Strength  The patient has normal right hip strength.   Tests  FABER: negative Ober: negative   Other  Erythema: absent Scars: absent Sensation: normal Pulse: present   Comments:  Normal but painful internal rotation right hip     Left Hip Exam    Range of Motion  Internal rotation: abnormal    Muscle Strength  The patient has normal left hip strength.    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  . Avascular necrosis of femur, unspecified laterality (HCC) Yes  . DDD (degenerative disc disease), lumbosacral    . Cigarette nicotine dependence with nicotine-induced  disorder        PLAN: (Rx., injectx, surgery, frx, mri/ct) RIGHT THA    The procedure has been fully reviewed with the patient; The risks and benefits of surgery have been discussed and explained and understood. Alternative treatment has also been reviewed, questions were encouraged and answered. The postoperative plan is also been reviewed.     No orders of the defined types were placed in this encounter.     Fuller Canada, MD

## 2018-02-11 ENCOUNTER — Inpatient Hospital Stay (HOSPITAL_COMMUNITY): Payer: Self-pay | Admitting: Anesthesiology

## 2018-02-11 ENCOUNTER — Encounter (HOSPITAL_COMMUNITY): Payer: Self-pay | Admitting: *Deleted

## 2018-02-11 ENCOUNTER — Inpatient Hospital Stay (HOSPITAL_COMMUNITY): Payer: Self-pay

## 2018-02-11 ENCOUNTER — Encounter (HOSPITAL_COMMUNITY)
Admission: RE | Disposition: A | Discharge status: Home Health | Payer: Self-pay | Source: Home / Self Care | Attending: Orthopedic Surgery

## 2018-02-11 ENCOUNTER — Other Ambulatory Visit: Payer: Self-pay

## 2018-02-11 ENCOUNTER — Inpatient Hospital Stay (HOSPITAL_COMMUNITY)
Admission: RE | Admit: 2018-02-11 | Discharge: 2018-02-13 | DRG: 470 | Disposition: A | Discharge status: Home Health | Payer: Self-pay | Attending: Orthopedic Surgery | Admitting: Orthopedic Surgery

## 2018-02-11 DIAGNOSIS — Z96641 Presence of right artificial hip joint: Secondary | ICD-10-CM

## 2018-02-11 DIAGNOSIS — I10 Essential (primary) hypertension: Secondary | ICD-10-CM | POA: Diagnosis present

## 2018-02-11 DIAGNOSIS — F17210 Nicotine dependence, cigarettes, uncomplicated: Secondary | ICD-10-CM | POA: Diagnosis present

## 2018-02-11 DIAGNOSIS — Z79899 Other long term (current) drug therapy: Secondary | ICD-10-CM

## 2018-02-11 DIAGNOSIS — M1611 Unilateral primary osteoarthritis, right hip: Principal | ICD-10-CM | POA: Diagnosis present

## 2018-02-11 DIAGNOSIS — M879 Osteonecrosis, unspecified: Secondary | ICD-10-CM | POA: Diagnosis present

## 2018-02-11 HISTORY — PX: TOTAL HIP ARTHROPLASTY: SHX124

## 2018-02-11 SURGERY — ARTHROPLASTY, HIP, TOTAL,POSTERIOR APPROACH
Anesthesia: General | Site: Hip | Laterality: Right

## 2018-02-11 MED ORDER — TRAMADOL HCL 50 MG PO TABS
50.0000 mg | ORAL_TABLET | Freq: Four times a day (QID) | ORAL | Status: DC
  Administered 2018-02-11 – 2018-02-13 (×6): 50 mg via ORAL
  Filled 2018-02-11 (×6): qty 1

## 2018-02-11 MED ORDER — PROPOFOL 10 MG/ML IV BOLUS
INTRAVENOUS | Status: DC | PRN
  Administered 2018-02-11: 180 mg via INTRAVENOUS

## 2018-02-11 MED ORDER — MIDAZOLAM HCL 5 MG/5ML IJ SOLN
INTRAMUSCULAR | Status: DC | PRN
  Administered 2018-02-11: 2 mg via INTRAVENOUS

## 2018-02-11 MED ORDER — ACETAMINOPHEN 500 MG PO TABS
1000.0000 mg | ORAL_TABLET | Freq: Four times a day (QID) | ORAL | Status: AC
  Administered 2018-02-11 – 2018-02-12 (×4): 1000 mg via ORAL
  Filled 2018-02-11 (×4): qty 2

## 2018-02-11 MED ORDER — METOCLOPRAMIDE HCL 5 MG/ML IJ SOLN: 5.0000 mg | Freq: Three times a day (TID) | INTRAMUSCULAR | Status: DC | PRN

## 2018-02-11 MED ORDER — HYDROCODONE-ACETAMINOPHEN 7.5-325 MG PO TABS: 1.0000 | ORAL_TABLET | Freq: Once | ORAL | Status: DC | PRN

## 2018-02-11 MED ORDER — SODIUM CHLORIDE 0.9 % IR SOLN
Status: DC | PRN
  Administered 2018-02-11: 3000 mL

## 2018-02-11 MED ORDER — HYDROMORPHONE HCL 1 MG/ML IJ SOLN
INTRAMUSCULAR | Status: AC
  Filled 2018-02-11: qty 1

## 2018-02-11 MED ORDER — PHENOL 1.4 % MT LIQD: 1.0000 | OROMUCOSAL | Status: DC | PRN

## 2018-02-11 MED ORDER — EPHEDRINE 5 MG/ML INJ
INTRAVENOUS | Status: AC
  Filled 2018-02-11: qty 10

## 2018-02-11 MED ORDER — AMLODIPINE BESYLATE 5 MG PO TABS
5.0000 mg | ORAL_TABLET | Freq: Every day | ORAL | Status: DC
  Administered 2018-02-12 – 2018-02-13 (×2): 5 mg via ORAL
  Filled 2018-02-11 (×2): qty 1

## 2018-02-11 MED ORDER — ONDANSETRON HCL 4 MG/2ML IJ SOLN
4.0000 mg | Freq: Once | INTRAMUSCULAR | Status: AC
  Administered 2018-02-11: 4 mg via INTRAVENOUS

## 2018-02-11 MED ORDER — LACTATED RINGERS IV SOLN
INTRAVENOUS | Status: DC
  Administered 2018-02-11: 1000 mL via INTRAVENOUS
  Administered 2018-02-11: 09:00:00 via INTRAVENOUS

## 2018-02-11 MED ORDER — ATORVASTATIN CALCIUM 20 MG PO TABS
20.0000 mg | ORAL_TABLET | Freq: Every day | ORAL | Status: DC
  Administered 2018-02-11 – 2018-02-13 (×3): 20 mg via ORAL
  Filled 2018-02-11 (×3): qty 1

## 2018-02-11 MED ORDER — LISINOPRIL 10 MG PO TABS
40.0000 mg | ORAL_TABLET | Freq: Every day | ORAL | Status: DC
  Administered 2018-02-11 – 2018-02-13 (×3): 40 mg via ORAL
  Filled 2018-02-11 (×3): qty 4

## 2018-02-11 MED ORDER — DEXAMETHASONE SODIUM PHOSPHATE 10 MG/ML IJ SOLN
10.0000 mg | Freq: Once | INTRAMUSCULAR | Status: AC
  Administered 2018-02-12: 10 mg via INTRAVENOUS
  Filled 2018-02-11: qty 1

## 2018-02-11 MED ORDER — GABAPENTIN 300 MG PO CAPS
300.0000 mg | ORAL_CAPSULE | Freq: Once | ORAL | Status: AC
  Administered 2018-02-11: 300 mg via ORAL

## 2018-02-11 MED ORDER — CEFAZOLIN SODIUM-DEXTROSE 2-4 GM/100ML-% IV SOLN
2.0000 g | INTRAVENOUS | Status: AC
  Administered 2018-02-11: 2 g via INTRAVENOUS

## 2018-02-11 MED ORDER — PROPOFOL 10 MG/ML IV BOLUS
INTRAVENOUS | Status: AC
  Filled 2018-02-11: qty 40

## 2018-02-11 MED ORDER — EPHEDRINE SULFATE 50 MG/ML IJ SOLN
INTRAMUSCULAR | Status: DC | PRN
  Administered 2018-02-11: 10 mg via INTRAVENOUS

## 2018-02-11 MED ORDER — MEPERIDINE HCL 50 MG/ML IJ SOLN: 6.2500 mg | INTRAMUSCULAR | Status: DC | PRN

## 2018-02-11 MED ORDER — PANTOPRAZOLE SODIUM 40 MG PO TBEC
40.0000 mg | DELAYED_RELEASE_TABLET | Freq: Every day | ORAL | Status: DC
  Administered 2018-02-12 – 2018-02-13 (×2): 40 mg via ORAL
  Filled 2018-02-11 (×2): qty 1

## 2018-02-11 MED ORDER — OXYCODONE HCL 5 MG PO TABS
5.0000 mg | ORAL_TABLET | Freq: Once | ORAL | Status: AC
  Administered 2018-02-11: 5 mg via ORAL
  Filled 2018-02-11: qty 1

## 2018-02-11 MED ORDER — BUPIVACAINE-EPINEPHRINE (PF) 0.25% -1:200000 IJ SOLN
INTRAMUSCULAR | Status: DC | PRN
  Administered 2018-02-11: 20 mL via PERINEURAL

## 2018-02-11 MED ORDER — NEOSTIGMINE METHYLSULFATE 3 MG/3ML IV SOSY
PREFILLED_SYRINGE | INTRAVENOUS | Status: AC
  Filled 2018-02-11: qty 3

## 2018-02-11 MED ORDER — TRANEXAMIC ACID-NACL 1000-0.7 MG/100ML-% IV SOLN: 1000.0000 mg | Freq: Once | INTRAVENOUS | Status: DC

## 2018-02-11 MED ORDER — CELECOXIB 100 MG PO CAPS
200.0000 mg | ORAL_CAPSULE | Freq: Two times a day (BID) | ORAL | Status: DC
  Administered 2018-02-11 – 2018-02-13 (×4): 200 mg via ORAL
  Filled 2018-02-11 (×4): qty 2

## 2018-02-11 MED ORDER — IBUPROFEN 800 MG PO TABS
ORAL_TABLET | ORAL | Status: AC
  Filled 2018-02-11: qty 1

## 2018-02-11 MED ORDER — IBUPROFEN 400 MG PO TABS
400.0000 mg | ORAL_TABLET | Freq: Once | ORAL | Status: AC
  Administered 2018-02-11: 400 mg via ORAL

## 2018-02-11 MED ORDER — MENTHOL 3 MG MT LOZG: 1.0000 | LOZENGE | OROMUCOSAL | Status: DC | PRN

## 2018-02-11 MED ORDER — ROCURONIUM BROMIDE 10 MG/ML (PF) SYRINGE
PREFILLED_SYRINGE | INTRAVENOUS | Status: AC
  Filled 2018-02-11: qty 10

## 2018-02-11 MED ORDER — FENTANYL CITRATE (PF) 250 MCG/5ML IJ SOLN
INTRAMUSCULAR | Status: AC
  Filled 2018-02-11: qty 5

## 2018-02-11 MED ORDER — BUPIVACAINE-EPINEPHRINE (PF) 0.25% -1:200000 IJ SOLN
INTRAMUSCULAR | Status: AC
  Filled 2018-02-11: qty 30

## 2018-02-11 MED ORDER — FENTANYL CITRATE (PF) 100 MCG/2ML IJ SOLN
INTRAMUSCULAR | Status: DC | PRN
  Administered 2018-02-11 (×9): 50 ug via INTRAVENOUS
  Administered 2018-02-11: 100 ug via INTRAVENOUS

## 2018-02-11 MED ORDER — CEFAZOLIN SODIUM-DEXTROSE 2-4 GM/100ML-% IV SOLN
INTRAVENOUS | Status: AC
  Filled 2018-02-11: qty 100

## 2018-02-11 MED ORDER — SENNOSIDES-DOCUSATE SODIUM 8.6-50 MG PO TABS: 1.0000 | ORAL_TABLET | Freq: Every evening | ORAL | Status: DC | PRN

## 2018-02-11 MED ORDER — CHLORHEXIDINE GLUCONATE 4 % EX LIQD: 60.0000 mL | Freq: Once | CUTANEOUS | Status: DC

## 2018-02-11 MED ORDER — TRANEXAMIC ACID-NACL 1000-0.7 MG/100ML-% IV SOLN
INTRAVENOUS | Status: AC
  Filled 2018-02-11: qty 100

## 2018-02-11 MED ORDER — OXYCODONE HCL 5 MG PO TABS
5.0000 mg | ORAL_TABLET | ORAL | Status: DC | PRN
  Administered 2018-02-12: 5 mg via ORAL
  Administered 2018-02-13: 10 mg via ORAL
  Filled 2018-02-11: qty 1
  Filled 2018-02-11: qty 2

## 2018-02-11 MED ORDER — MIDAZOLAM HCL 2 MG/2ML IJ SOLN
INTRAMUSCULAR | Status: AC
  Filled 2018-02-11: qty 2

## 2018-02-11 MED ORDER — ALUM & MAG HYDROXIDE-SIMETH 200-200-20 MG/5ML PO SUSP: 30.0000 mL | ORAL | Status: DC | PRN

## 2018-02-11 MED ORDER — PHENYLEPHRINE 40 MCG/ML (10ML) SYRINGE FOR IV PUSH (FOR BLOOD PRESSURE SUPPORT)
PREFILLED_SYRINGE | INTRAVENOUS | Status: AC
  Filled 2018-02-11: qty 10

## 2018-02-11 MED ORDER — PROMETHAZINE HCL 25 MG/ML IJ SOLN: 6.2500 mg | INTRAMUSCULAR | Status: DC | PRN

## 2018-02-11 MED ORDER — HYDROMORPHONE HCL 1 MG/ML IJ SOLN
0.2500 mg | INTRAMUSCULAR | Status: DC | PRN
  Administered 2018-02-11 (×4): 0.5 mg via INTRAVENOUS
  Filled 2018-02-11 (×3): qty 0.5

## 2018-02-11 MED ORDER — 0.9 % SODIUM CHLORIDE (POUR BTL) OPTIME
TOPICAL | Status: DC | PRN
  Administered 2018-02-11: 1000 mL

## 2018-02-11 MED ORDER — METHOCARBAMOL 1000 MG/10ML IJ SOLN
500.0000 mg | Freq: Once | INTRAVENOUS | Status: AC
  Administered 2018-02-11: 500 mg via INTRAVENOUS
  Filled 2018-02-11: qty 5

## 2018-02-11 MED ORDER — BUPIVACAINE LIPOSOME 1.3 % IJ SUSP
INTRAMUSCULAR | Status: AC
  Filled 2018-02-11: qty 20

## 2018-02-11 MED ORDER — ONDANSETRON HCL 4 MG PO TABS: 4.0000 mg | ORAL_TABLET | Freq: Four times a day (QID) | ORAL | Status: DC | PRN

## 2018-02-11 MED ORDER — GLYCOPYRROLATE PF 0.2 MG/ML IJ SOSY
PREFILLED_SYRINGE | INTRAMUSCULAR | Status: AC
  Filled 2018-02-11: qty 2

## 2018-02-11 MED ORDER — METHOCARBAMOL 1000 MG/10ML IJ SOLN: 500.0000 mg | Freq: Four times a day (QID) | INTRAVENOUS | Status: DC | PRN

## 2018-02-11 MED ORDER — LACTATED RINGERS IV SOLN: INTRAVENOUS | Status: DC

## 2018-02-11 MED ORDER — DIPHENHYDRAMINE HCL 12.5 MG/5ML PO ELIX: 12.5000 mg | ORAL_SOLUTION | ORAL | Status: DC | PRN

## 2018-02-11 MED ORDER — ONDANSETRON HCL 4 MG/2ML IJ SOLN: 4.0000 mg | Freq: Four times a day (QID) | INTRAMUSCULAR | Status: DC | PRN

## 2018-02-11 MED ORDER — NEOSTIGMINE METHYLSULFATE 10 MG/10ML IV SOLN
INTRAVENOUS | Status: DC | PRN
  Administered 2018-02-11: 2 mg via INTRAVENOUS

## 2018-02-11 MED ORDER — HYDROMORPHONE HCL 1 MG/ML IJ SOLN: 0.5000 mg | INTRAMUSCULAR | Status: DC | PRN

## 2018-02-11 MED ORDER — DOCUSATE SODIUM 100 MG PO CAPS
100.0000 mg | ORAL_CAPSULE | Freq: Two times a day (BID) | ORAL | Status: DC
  Administered 2018-02-11 – 2018-02-13 (×4): 100 mg via ORAL
  Filled 2018-02-11 (×4): qty 1

## 2018-02-11 MED ORDER — SODIUM CHLORIDE 0.9 % IV SOLN
INTRAVENOUS | Status: DC
  Administered 2018-02-11 – 2018-02-12 (×2): via INTRAVENOUS

## 2018-02-11 MED ORDER — ONDANSETRON HCL 4 MG/2ML IJ SOLN
INTRAMUSCULAR | Status: AC
  Filled 2018-02-11: qty 2

## 2018-02-11 MED ORDER — GLYCOPYRROLATE 0.2 MG/ML IJ SOLN
INTRAMUSCULAR | Status: DC | PRN
  Administered 2018-02-11: .3 mg via INTRAVENOUS

## 2018-02-11 MED ORDER — ACETAMINOPHEN 500 MG PO TABS
500.0000 mg | ORAL_TABLET | Freq: Once | ORAL | Status: AC
  Administered 2018-02-11: 500 mg via ORAL
  Filled 2018-02-11: qty 1

## 2018-02-11 MED ORDER — SUCCINYLCHOLINE 20MG/ML (10ML) SYRINGE FOR MEDFUSION PUMP - OPTIME
INTRAMUSCULAR | Status: DC | PRN
  Administered 2018-02-11: 120 mg via INTRAVENOUS

## 2018-02-11 MED ORDER — ASPIRIN EC 325 MG PO TBEC
325.0000 mg | DELAYED_RELEASE_TABLET | Freq: Two times a day (BID) | ORAL | Status: DC
  Administered 2018-02-11 – 2018-02-13 (×4): 325 mg via ORAL
  Filled 2018-02-11 (×4): qty 1

## 2018-02-11 MED ORDER — GABAPENTIN 300 MG PO CAPS
300.0000 mg | ORAL_CAPSULE | Freq: Three times a day (TID) | ORAL | Status: DC
  Administered 2018-02-11 – 2018-02-13 (×6): 300 mg via ORAL
  Filled 2018-02-11 (×6): qty 1

## 2018-02-11 MED ORDER — METHOCARBAMOL 500 MG PO TABS: 500.0000 mg | ORAL_TABLET | Freq: Four times a day (QID) | ORAL | Status: DC | PRN

## 2018-02-11 MED ORDER — BUPIVACAINE LIPOSOME 1.3 % IJ SUSP
INTRAMUSCULAR | Status: DC | PRN
  Administered 2018-02-11: 20 mL

## 2018-02-11 MED ORDER — TRANEXAMIC ACID-NACL 1000-0.7 MG/100ML-% IV SOLN
1000.0000 mg | INTRAVENOUS | Status: AC
  Administered 2018-02-11: 1000 mg via INTRAVENOUS

## 2018-02-11 MED ORDER — ROCURONIUM 10MG/ML (10ML) SYRINGE FOR MEDFUSION PUMP - OPTIME
INTRAVENOUS | Status: DC | PRN
  Administered 2018-02-11 (×2): 10 mg via INTRAVENOUS
  Administered 2018-02-11: 40 mg via INTRAVENOUS
  Administered 2018-02-11: 10 mg via INTRAVENOUS

## 2018-02-11 MED ORDER — METOCLOPRAMIDE HCL 10 MG PO TABS: 5.0000 mg | ORAL_TABLET | Freq: Three times a day (TID) | ORAL | Status: DC | PRN

## 2018-02-11 MED ORDER — SUCCINYLCHOLINE CHLORIDE 200 MG/10ML IV SOSY
PREFILLED_SYRINGE | INTRAVENOUS | Status: AC
  Filled 2018-02-11: qty 10

## 2018-02-11 MED ORDER — CEFAZOLIN SODIUM-DEXTROSE 2-4 GM/100ML-% IV SOLN
2.0000 g | Freq: Four times a day (QID) | INTRAVENOUS | Status: AC
  Administered 2018-02-11 (×2): 2 g via INTRAVENOUS
  Filled 2018-02-11 (×2): qty 100

## 2018-02-11 SURGICAL SUPPLY — 73 items
ACETAB CUP W GRIPTION 54MM (Plate) ×1 IMPLANT
ACETAB CUP W/GRIPTION 54 (Plate) ×2 IMPLANT
BIT DRILL 2.8X128 (BIT) ×2 IMPLANT
BIT DRILL 2.8X128MM (BIT) ×1
BLADE HEX COATED 2.75 (ELECTRODE) ×3 IMPLANT
BLADE SAGITTAL 25.0X1.27X90 (BLADE) ×2 IMPLANT
BLADE SAGITTAL 25.0X1.27X90MM (BLADE) ×1
CLOTH BEACON ORANGE TIMEOUT ST (SAFETY) ×3 IMPLANT
COVER LIGHT HANDLE STERIS (MISCELLANEOUS) ×6 IMPLANT
COVER WAND RF STERILE (DRAPES) ×2 IMPLANT
CUP ACETAB W/GRIPTION 54 (Plate) IMPLANT
DECANTER SPIKE VIAL GLASS SM (MISCELLANEOUS) ×4 IMPLANT
DRAPE BACK TABLE (DRAPES) ×3 IMPLANT
DRAPE HIP W/POCKET STRL (DRAPE) ×3 IMPLANT
DRAPE INCISE IOBAN 44X35 STRL (DRAPES) ×3 IMPLANT
DRSG MEPILEX BORDER 4X12 (GAUZE/BANDAGES/DRESSINGS) ×3 IMPLANT
DURAPREP 26ML APPLICATOR (WOUND CARE) ×6 IMPLANT
ELECT REM PT RETURN 9FT ADLT (ELECTROSURGICAL) ×3
ELECTRODE REM PT RTRN 9FT ADLT (ELECTROSURGICAL) ×1 IMPLANT
ELIMINATOR HOLE APEX DEPUY (Hips) ×2 IMPLANT
EVACUATOR 3/16  PVC DRAIN (DRAIN)
EVACUATOR 3/16 PVC DRAIN (DRAIN) IMPLANT
GLOVE BIO SURGEON STRL SZ7 (GLOVE) ×4 IMPLANT
GLOVE BIOGEL PI IND STRL 7.0 (GLOVE) ×1 IMPLANT
GLOVE BIOGEL PI INDICATOR 7.0 (GLOVE) ×8
GLOVE SKINSENSE NS SZ8.0 LF (GLOVE) ×4
GLOVE SKINSENSE STRL SZ8.0 LF (GLOVE) ×2 IMPLANT
GLOVE SS N UNI LF 8.5 STRL (GLOVE) ×3 IMPLANT
GOWN STRL REUS W/TWL LRG LVL3 (GOWN DISPOSABLE) ×9 IMPLANT
GOWN STRL REUS W/TWL XL LVL3 (GOWN DISPOSABLE) ×3 IMPLANT
HANDPIECE INTERPULSE COAX TIP (DISPOSABLE) ×3
HOOD W/PEELAWAY (MISCELLANEOUS) ×10 IMPLANT
INST SET MAJOR BONE (KITS) ×3 IMPLANT
IV NS IRRIG 3000ML ARTHROMATIC (IV SOLUTION) ×3 IMPLANT
KIT BLADEGUARD II DBL (SET/KITS/TRAYS/PACK) ×3 IMPLANT
KIT TURNOVER KIT A (KITS) ×3 IMPLANT
LINER NEUTRAL 54X36MM PLUS 4 (Hips) ×2 IMPLANT
MANIFOLD NEPTUNE II (INSTRUMENTS) ×3 IMPLANT
MARKER SKIN DUAL TIP RULER LAB (MISCELLANEOUS) ×3 IMPLANT
NDL HYPO 18GX1.5 BLUNT FILL (NEEDLE) ×2 IMPLANT
NDL HYPO 21X1.5 SAFETY (NEEDLE) ×1 IMPLANT
NDL HYPO 25X1 1.5 SAFETY (NEEDLE) ×1 IMPLANT
NEEDLE HYPO 18GX1.5 BLUNT FILL (NEEDLE) ×6 IMPLANT
NEEDLE HYPO 21X1.5 SAFETY (NEEDLE) ×3 IMPLANT
NEEDLE HYPO 25X1 1.5 SAFETY (NEEDLE) ×3 IMPLANT
NS IRRIG 1000ML POUR BTL (IV SOLUTION) ×3 IMPLANT
PACK TOTAL JOINT (CUSTOM PROCEDURE TRAY) ×3 IMPLANT
PAD ARMBOARD 7.5X6 YLW CONV (MISCELLANEOUS) ×3 IMPLANT
PASSER SUT SWANSON 36MM LOOP (INSTRUMENTS) IMPLANT
PILLOW HIP ABDUCTION LRG (ORTHOPEDIC SUPPLIES) IMPLANT
PILLOW HIP ABDUCTION MED (ORTHOPEDIC SUPPLIES) IMPLANT
PIN STMN SNGL STERILE 9X3.6MM (PIN) ×6 IMPLANT
SCREW 6.5MMX25MM (Screw) ×2 IMPLANT
SET BASIN LINEN APH (SET/KITS/TRAYS/PACK) ×3 IMPLANT
SET HNDPC FAN SPRY TIP SCT (DISPOSABLE) ×1 IMPLANT
SPONGE LAP 18X18 RF (DISPOSABLE) ×3 IMPLANT
STAPLER VISISTAT 35W (STAPLE) ×3 IMPLANT
STEM TRI LOC BPS GRIPTON SZ 5 (Hips) IMPLANT
SUT BRALON NAB BRD #1 30IN (SUTURE) ×8 IMPLANT
SUT ETHIBOND 5 LR DA (SUTURE) ×3 IMPLANT
SUT MNCRL 0 VIOLET CTX 36 (SUTURE) ×1 IMPLANT
SUT MON AB 2-0 CT1 36 (SUTURE) ×3 IMPLANT
SUT MONOCRYL 0 CTX 36 (SUTURE) ×4
SUT VIC AB 1 CT1 27 (SUTURE) ×6
SUT VIC AB 1 CT1 27XBRD ANTBC (SUTURE) ×2 IMPLANT
SYR 20CC LL (SYRINGE) ×9 IMPLANT
SYR 30ML LL (SYRINGE) ×3 IMPLANT
SYR BULB IRRIGATION 50ML (SYRINGE) ×3 IMPLANT
TOWEL OR 17X26 4PK STRL BLUE (TOWEL DISPOSABLE) ×3 IMPLANT
TOWER CARTRIDGE SMART MIX (DISPOSABLE) IMPLANT
TRAY FOLEY METER SIL LF 16FR (CATHETERS) ×3 IMPLANT
TRI LOC BPS W GRIPTON SZ 5 (Hips) ×3 IMPLANT
YANKAUER SUCT 12FT TUBE ARGYLE (SUCTIONS) ×3 IMPLANT

## 2018-02-11 NOTE — Op Note (Signed)
02/11/2018  10:19 AM  PATIENT:  Cameron Flynn  53 y.o. male  PRE-OPERATIVE DIAGNOSIS:  right hip osteoarthritis  POST-OPERATIVE DIAGNOSIS:  right hip osteoarthritis  PROCEDURE:  Procedure(s) with comments: TOTAL HIP ARTHROPLASTY (Right) - per office request, letter mailed to patient with PAT information--[id number]  DEPUY PINNACLE GRIPTION CUP 54 32 HEAD +5 NECK HO TRILOCK STEM SIZE 5   SURGEON:  Surgeon(s) and Role:    Vickki Hearing, MD - Primary  DICTATION: Surgical dictation for  RIGHT hip replacement  The patient was identified in the preoperative holding area. The site was marked. The chart was reviewed and updated. The patient was taken to the operating room for GENERAL anesthesia. A Foley catheter was inserted. The patient was placed in lateral decubitus position with the operative site up. Stulberg positioner was used to hold the patient in place and axillary roll was provided as needed.  Standard preoperative antibiotics were given per protocol using SCIP recommendations.  After the sterile prep and drape the timeout was completed. All implants were accounted for x-rays were visible and everyone agreed on the surgical site AS RIGHT HIP     The incision was made over the greater trochanter and deepened through subcutaneous tissue. The fascia was exposed and incised in line with the skin incision. The greater trochanteric bursa was excised. Blunt dissection was carried out in line of the fibers of the gluteus medius. The gluteus medius was subperiosteally dissected from the greater trochanter in line with the vastus lateralis and included the gluteus minimus. These structures were tagged and retracted proximally. 2 Steinmann pins were placed in the pelvis as retractors.  The hip capsule was excised and the hip was exposed. The hip was dislocated anteriorly. A cutting guide was used to perform the proximal neck cut. The head was removed.  The acetabulum was evaluated and  labrum and osteophytes were resected anteriorly and posterior retractors were placed.  Reaming was initiated with a 44 reamer and progressively this increased up to a size 54. We then placed a trial acetabular shell.  Bone graft was PERFORMED WITH THE LASt REAMINGS   The acetabular shell was placed and checked for security.  1, 25 mm Screw was placed    We next turned our attention to the femur. A starter hole reamer was passed followed by box osteotome followed by canal finder.  We broached the canal up to a size 5  We then performed trial reduction with a size 5 femur size 54 acetabulum and a size 36 head with 1.5 then 5 HO  neck length.  Leg length was confirmed first followed by shuck test followed by flexion internal rotation test followed by external rotation extension test followed by sleep test.  Once I was satisfied with the reduction and stability the trial components were removed. Drill holes were passed through the greater trochanter and #5 Ethibond suture was passed through the drill holes. The implant was placed. The size36/+5  head was placed and the hip was reduced  The hip was taken back through the same test as performed during the trial and I was satisfied with the reduction and stability  The wound was irrigated thoroughly. The abductors were repaired including the vastus lateralis with #1 Bralon suture. The hip was then abducted and the fascia was closed with #1 Bralon suture.  Skin was closed with 0 Monocryl suture.  Skin staples were used to reapproximate the skin edges  Exparel dilute 20cc was used along  with Marcaine with epinephrine  PHYSICIAN ASSISTANT:   ASSISTANTS: BETTY ASHLEY    ANESTHESIA:   general  EBL:  50 mL   BLOOD ADMINISTERED:none  DRAINS: none   LOCAL MEDICATIONS USED:  EXPAREL 20 AND 30 CC MARCAINE W EPI   SPECIMEN:  No Specimen  DISPOSITION OF SPECIMEN:  N/A  COUNTS:  YES  TOURNIQUET:  * No tourniquets in log  *  DICTATION: .Dragon Dictation  PLAN OF CARE: Admit to inpatient   PATIENT DISPOSITION:  PACU - hemodynamically stable.   Delay start of Pharmacological VTE agent (>24hrs) due to surgical blood loss or risk of bleeding: no

## 2018-02-11 NOTE — Brief Op Note (Signed)
02/11/2018  10:10 AM  PATIENT:  [name]  53 y.o. male  PRE-OPERATIVE DIAGNOSIS:  right hip osteoarthritis  POST-OPERATIVE DIAGNOSIS:  right hip osteoarthritis  PROCEDURE:  Procedure(s) with comments: TOTAL HIP ARTHROPLASTY (Right) - per office request, letter mailed to patient with PAT information  DEPUY PINNACLE GRIPTION CUP 54 32 HEAD +5 NECK HO TRILOCK STEM SIZE 5   SURGEON:  Surgeon(s) and Role:    * Vickki Hearing, MD - Primary  PHYSICIAN ASSISTANT:   ASSISTANTS: BETTY ASHLEY    ANESTHESIA:   general  EBL:  50 mL   BLOOD ADMINISTERED:none  DRAINS: none   LOCAL MEDICATIONS USED:  EXPAREL 20 AND 30 CC MARCAINE W EPI   SPECIMEN:  No Specimen  DISPOSITION OF SPECIMEN:  N/A  COUNTS:  YES  TOURNIQUET:  * No tourniquets in log *  DICTATION: .Dragon Dictation  PLAN OF CARE: Admit to inpatient   PATIENT DISPOSITION:  PACU - hemodynamically stable.   Delay start of Pharmacological VTE agent (>24hrs) due to surgical blood loss or risk of bleeding: no

## 2018-02-11 NOTE — Interval H&P Note (Signed)
History and Physical Interval Note:  02/11/2018 7:56 AM  Cameron Flynn  has presented today for surgery, with the diagnosis of right hip osteoarthritis  The various methods of treatment have been discussed with the patient and family. After consideration of risks, benefits and other options for treatment, the patient has consented to  Procedure(s) with comments: TOTAL HIP ARTHROPLASTY (Right) - per office request, letter mailed to patient with PAT information as a surgical intervention .  The patient's history has been reviewed, patient examined, no change in status, stable for surgery.  I have reviewed the patient's chart and labs.  Questions were answered to the patient's satisfaction.     Fuller Canada

## 2018-02-11 NOTE — Anesthesia Preprocedure Evaluation (Signed)
Anesthesia Evaluation    Airway Mallampati: II       Dental  (+) Edentulous Lower, Edentulous Upper   Pulmonary Current Smoker,    breath sounds clear to auscultation + decreased breath sounds      Cardiovascular hypertension,  Rhythm:regular     Neuro/Psych    GI/Hepatic   Endo/Other    Renal/GU      Musculoskeletal   Abdominal   Peds  Hematology   Anesthesia Other Findings SR at 5377 with RBBB w/ essential HTN Heavy ETOH use/abuse Tobacco abuse Flat affect, seems reluctant to answer questions  Reproductive/Obstetrics                             Anesthesia Physical Anesthesia Plan  ASA: III  Anesthesia Plan: General   Post-op Pain Management:    Induction:   PONV Risk Score and Plan:   Airway Management Planned:   Additional Equipment:   Intra-op Plan:   Post-operative Plan:   Informed Consent: I have reviewed the patients History and Physical, chart, labs and discussed the procedure including the risks, benefits and alternatives for the proposed anesthesia with the patient or authorized representative who has indicated his/her understanding and acceptance.       Plan Discussed with: Anesthesiologist  Anesthesia Plan Comments:         Anesthesia Quick Evaluation

## 2018-02-11 NOTE — Brief Op Note (Signed)
02/11/2018  10:19 AM  PATIENT:  [name]  53 y.o. male  PRE-OPERATIVE DIAGNOSIS:  right hip osteoarthritis  POST-OPERATIVE DIAGNOSIS:  right hip osteoarthritis  PROCEDURE:  Procedure(s) with comments: TOTAL HIP ARTHROPLASTY (Right) - per office request, letter mailed to patient with PAT information  DEPUY PINNACLE GRIPTION CUP 54 32 HEAD +5 NECK HO TRILOCK STEM SIZE 5   SURGEON:  Surgeon(s) and Role:    * Vickki Hearing, MD - Primary  PHYSICIAN ASSISTANT:   ASSISTANTS: BETTY ASHLEY    ANESTHESIA:   general  EBL:  50 mL   BLOOD ADMINISTERED:none  DRAINS: none   LOCAL MEDICATIONS USED:  EXPAREL 20 AND 30 CC MARCAINE W EPI   SPECIMEN:  No Specimen  DISPOSITION OF SPECIMEN:  N/A  COUNTS:  YES  TOURNIQUET:  * No tourniquets in log *  DICTATION: .Dragon Dictation  PLAN OF CARE: Admit to inpatient   PATIENT DISPOSITION:  PACU - hemodynamically stable.   Delay start of Pharmacological VTE agent (>24hrs) due to surgical blood loss or risk of bleeding: no

## 2018-02-11 NOTE — Anesthesia Procedure Notes (Signed)
Procedure Name: Intubation Date/Time: 02/11/2018 8:07 AM Performed by: Ollen Bowl, CRNA Pre-anesthesia Checklist: Patient identified, Patient being monitored, Timeout performed, Emergency Drugs available and Suction available Patient Re-evaluated:Patient Re-evaluated prior to induction Oxygen Delivery Method: Circle system utilized Preoxygenation: Pre-oxygenation with 100% oxygen Induction Type: IV induction Ventilation: Mask ventilation without difficulty Laryngoscope Size: Mac and 3 Grade View: Grade I Tube type: Oral Tube size: 7.0 mm Number of attempts: 1 Airway Equipment and Method: Stylet Placement Confirmation: ETT inserted through vocal cords under direct vision,  positive ETCO2 and breath sounds checked- equal and bilateral Secured at: 22 cm Tube secured with: Tape Dental Injury: Teeth and Oropharynx as per pre-operative assessment

## 2018-02-11 NOTE — Transfer of Care (Signed)
Immediate Anesthesia Transfer of Care Note  Patient: Cameron Flynn  Procedure(s) Performed: TOTAL HIP ARTHROPLASTY (Right Hip)  Patient Location: PACU  Anesthesia Type:General  Level of Consciousness: awake  Airway & Oxygen Therapy: Patient Spontanous Breathing and Patient connected to face mask oxygen  Post-op Assessment: Report given to RN  Post vital signs: Reviewed  Last Vitals:  Vitals Value Taken Time  BP 126/65 02/11/2018 10:18 AM  Temp 36.5 C 02/11/2018 10:17 AM  Pulse 62 02/11/2018 10:23 AM  Resp 20 02/11/2018 10:23 AM  SpO2 99 % 02/11/2018 10:23 AM  Vitals shown include unvalidated device data.  Last Pain:  Vitals:   02/11/18 0643  TempSrc: Oral  PainSc: 7       Patients Stated Pain Goal: 6 (02/11/18 [id number])  Complications: No apparent anesthesia complications

## 2018-02-11 NOTE — Anesthesia Postprocedure Evaluation (Signed)
Anesthesia Post Note  Patient: Cameron Flynn  Procedure(s) Performed: TOTAL HIP ARTHROPLASTY (Right Hip)  Patient location during evaluation: PACU Anesthesia Type: General Level of consciousness: awake and alert and oriented Pain management: pain level controlled Vital Signs Assessment: post-procedure vital signs reviewed and stable Respiratory status: spontaneous breathing Cardiovascular status: blood pressure returned to baseline and stable Postop Assessment: no apparent nausea or vomiting Anesthetic complications: no     Last Vitals:  Vitals:   02/11/18 1122 02/11/18 1130  BP:  102/61  Pulse: 65 70  Resp: 17 16  Temp:    SpO2: (!) 89% (!) 88%    Last Pain:  Vitals:   02/11/18 1108  TempSrc:   PainSc: 5                  Jinnie Onley

## 2018-02-12 ENCOUNTER — Encounter (HOSPITAL_COMMUNITY): Payer: Self-pay | Admitting: Orthopedic Surgery

## 2018-02-12 LAB — CBC
HCT: 37.6 % — ABNORMAL LOW (ref 39.0–52.0)
Hemoglobin: 12.3 g/dL — ABNORMAL LOW (ref 13.0–17.0)
MCH: 32.8 pg (ref 26.0–34.0)
MCHC: 32.7 g/dL (ref 30.0–36.0)
MCV: 100.3 fL — AB (ref 80.0–100.0)
Platelets: 207 10*3/uL (ref 150–400)
RBC: 3.75 MIL/uL — ABNORMAL LOW (ref 4.22–5.81)
RDW: 13.9 % (ref 11.5–15.5)
WBC: 9.6 10*3/uL (ref 4.0–10.5)
nRBC: 0 % (ref 0.0–0.2)

## 2018-02-12 LAB — BASIC METABOLIC PANEL
Anion gap: 8 (ref 5–15)
BUN: 9 mg/dL (ref 6–20)
CO2: 25 mmol/L (ref 22–32)
Calcium: 8.2 mg/dL — ABNORMAL LOW (ref 8.9–10.3)
Chloride: 102 mmol/L (ref 98–111)
Creatinine, Ser: 0.66 mg/dL (ref 0.61–1.24)
GFR calc Af Amer: >60 mL/min (ref >60)
GFR calc non Af Amer: >60 mL/min (ref >60)
GLUCOSE: 101 mg/dL — AB (ref 70–99)
Potassium: 3.7 mmol/L (ref 3.5–5.1)
Sodium: 135 mmol/L (ref 135–145)

## 2018-02-12 NOTE — Anesthesia Postprocedure Evaluation (Signed)
Anesthesia Post Note  Patient: Cameron Flynn  Procedure(s) Performed: TOTAL HIP ARTHROPLASTY (Right Hip)  Patient location during evaluation: Nursing Unit Anesthesia Type: General Level of consciousness: awake and alert and oriented Pain management: pain level controlled Vital Signs Assessment: post-procedure vital signs reviewed and stable Respiratory status: spontaneous breathing Cardiovascular status: stable Postop Assessment: no apparent nausea or vomiting Anesthetic complications: no     Last Vitals:  Vitals:   02/12/18 0028 02/12/18 0408  BP: 117/72 121/64  Pulse: 74 65  Resp: 20 18  Temp: 37.2 C 36.9 C  SpO2: 97% 96%    Last Pain:  Vitals:   02/12/18 0524  TempSrc:   PainSc: 3                  Kiernan Farkas A

## 2018-02-12 NOTE — Progress Notes (Signed)
Foley Catheter removed. 200cc's of clear yellow urine emptied from bag. Patient educated on removal and instructed to urinate as soon as possible. Patient out of bed with walker. Ambulated in room with assistance. Tolerated well. Patient now resting in bed. Call bell within reach. Bed alarm is on. Will continue to monitor.

## 2018-02-12 NOTE — Care Management Note (Signed)
Case Management Note  Patient Details  Name: Cameron Flynn MRN: [id number] Date of Birth: January 15, 1965  Subjective/Objective:       S/p hip repair. Pt from home, lives with s/o, Pt is uninsured and unemployed. Pt goes to [facility] for PCP care. Pt will f/u with surgeon. Per pt's s/o they have made arrangements for 24/7 care for the next few weeks and are prepared for pt to return home after surgery. Pt has RW but needs HH PT.              Action/Plan: Referral sent to [facility] as it is there week for [facility]. Alvino Chapel, [facility] rep, to take referral. anticipate DC home tomorrow.   Expected Discharge Date:     02/13/2018             Expected Discharge Plan:  Home w Home Health Services  In-House Referral:  NA  Discharge planning Services  CM Consult  Post Acute Care Choice:  Home Health Choice offered to:  Patient  HH Arranged:  PT HH Agency:  [facility]  Status of Service:  Completed, signed off  Malcolm Metro, RN 02/12/2018, 3:09 PM

## 2018-02-12 NOTE — Plan of Care (Signed)
  Problem: Acute Rehab PT Goals(only PT should resolve) Goal: Pt Will Go Supine/Side To Sit Outcome: Progressing Flowsheets (Taken 02/12/2018 1223) Pt will go Supine/Side to Sit: with modified independence Goal: Patient Will Transfer Sit To/From Stand Outcome: Progressing Flowsheets (Taken 02/12/2018 1223) Patient will transfer sit to/from stand: with supervision Goal: Pt Will Transfer Bed To Chair/Chair To Bed Outcome: Progressing Flowsheets (Taken 02/12/2018 1223) Pt will Transfer Bed to Chair/Chair to Bed: with supervision Goal: Pt Will Ambulate Outcome: Progressing Flowsheets (Taken 02/12/2018 1223) Pt will Ambulate: 100 feet; with supervision; with rolling walker   12:23 PM, 02/12/18 Ocie Bob, MPT Physical Therapist with [facility] [phone] office [phone] mobile phone

## 2018-02-12 NOTE — Progress Notes (Signed)
Patient ID: Cameron Flynn, male   DOB: 28-Apr-1965, 53 y.o.   MRN: [id number] Postop day 1 right total hip  BP 121/64 (BP Location: Right Arm)   Pulse 65   Temp 98.5 F (36.9 C) (Oral)   Resp 18   SpO2 96%   CBC Latest Ref Rng & Units 02/12/2018 02/07/2018  WBC 4.0 - 10.5 K/uL 9.6 9.8  Hemoglobin 13.0 - 17.0 g/dL 12.3(L) 14.7  Hematocrit 39.0 - 52.0 % 37.6(L) 43.8  Platelets 150 - 400 K/uL 207 285    BMP Latest Ref Rng & Units 02/12/2018 02/07/2018 10/10/2017  Glucose 70 - 99 mg/dL 749(S) 96 496(P)  BUN 6 - 20 mg/dL 9 11 10   Creatinine 0.61 - 1.24 mg/dL 5.91 6.38 4.66  Sodium 135 - 145 mmol/L 135 134(L) 137  Potassium 3.5 - 5.1 mmol/L 3.7 4.5 4.3  Chloride 98 - 111 mmol/L 102 104 102  CO2 22 - 32 mmol/L 25 20(L) 25  Calcium 8.9 - 10.3 mg/dL 8.2(L) 8.8(L) 9.1   Hemoglobin 12.3 stable electrolytes look normal.  Patient's pain is under fairly good control he rates it as moderate  Continue therapy hopefully discharge tomorrow

## 2018-02-12 NOTE — Clinical Social Work Note (Signed)
CSW consult received for SNF rehab placement. Pt does not have insurance and plan is for return to home with support from his significant other. Will clear the consult at this time.

## 2018-02-12 NOTE — Addendum Note (Signed)
Addendum  created 02/12/18 1016 by Earleen Newport, CRNA   Clinical Note Signed

## 2018-02-12 NOTE — Care Management (Signed)
Patient Information   Patient Name Cameron Flynn, Cameron Flynn ([id number]) Sex Male DOB 10-26-1965  Room Bed  A325 A325-01  Patient Demographics   Address [address] [place] [address] Phone [phone] (Home) [phone] (Mobile)  Patient Ethnicity & Race   Ethnic Group Patient Race  Not Hispanic or Latino White or Caucasian  Emergency Contact(s)   Name Relation Home Work Mobile  Katharina Caper Significant other   [phone]  Documents on File    Status Date Received Description  Documents for the Patient  [place] HIPAA NOTICE OF PRIVACY - Scanned Not Received    [facility] E-Signature HIPAA Notice of Privacy Received 10/28/11   Driver's License Received 10/10/17 EXP 04/24/2024  Insurance Card Not Received    Advance Directives/Living Will/HCPOA/POA Not Received    Other Photo ID Not Received    [place] E-Signature HIPAA Notice of Privacy Spanish     Release of Information Not Received    Advanced Beneficiary Notice (ABN) Not Received    E-Signature AOB Spanish Not Received    Driver's License Received 10/21/17 [place] EXPIRES 04/24/2024  Patient Photo   Photo of Patient  Documents for the Encounter  AOB (Assignment of Insurance Benefits) Not Received    E-signature AOB Signed 02/11/18   MEDICARE RIGHTS Not Received    E-signature Medicare Rights     Admission Information   Current Information   Attending Provider Admitting Provider Admission Type Admission Status  Vickki Hearing, MD Vickki Hearing, MD Elective Admission (Confirmed)       Admission Date/Time Discharge Date Hospital Service Auth/Cert Status  02/11/18 06:09 AM  Surgery Incomplete       Hospital Area Unit Room/Bed   [facility] AP-DEPT 300 A325/A325-01        Admission   Complaint  right hip osteoarthritis  Hospital Account   Name Acct ID Class Status Primary Coverage  Cameron Flynn, Cameron Flynn [id number] Inpatient Open MEDICAID POTENTIAL - MEDICAID POTENTIAL      Guarantor  Account (for Hospital Account [id number])   Name Relation to Pt Service Area Active? Acct Type  Cameron Flynn Self CHSA Yes Personal/Family  Address Phone    [address] [place], [place] [address] [phone](H)        Coverage Information (for Hospital Account [id number])   F/O Payor/Plan Precert #  MEDICAID POTENTIAL/MEDICAID POTENTIAL   Subscriber Subscriber #  Cameron Flynn, Cameron Flynn [id number]  Address Phone  [address] [address] [place], [place] [address] [phone]       Care Everywhere ID:  [id number]

## 2018-02-12 NOTE — Evaluation (Signed)
Physical Therapy Evaluation Patient Details Name: Cameron Flynn MRN: [id number] DOB: 1965-08-22 Today's Date: 02/12/2018   History of Present Illness  Cameron Flynn is a 53 y/o male s/p Right THA 02/11/18, with the diagnosis of right hip osteoarthritis    Clinical Impression  Patient requires occasional repeated verbal cues possibly due to [facility], demonstrates slow labored movement with good return for using BUE to sit up and scoot to bedside, no loss of balance during gait training, had difficulty making turns with RW and limited for ambulation due to c/o increasing right hip pain/fatigue.  Patient tolerated sitting up in chair after therapy - RN aware.  Patient will benefit from continued physical therapy in hospital and recommended venue below to increase strength, balance, endurance for safe ADLs and gait.    Follow Up Recommendations Home health PT;Supervision for mobility/OOB;Supervision - Intermittent    Equipment Recommendations  None recommended by PT    Recommendations for Other Services       Precautions / Restrictions Precautions Precautions: Fall Restrictions Weight Bearing Restrictions: Yes RLE Weight Bearing: Weight bearing as tolerated      Mobility  Bed Mobility Overal bed mobility: Needs Assistance Bed Mobility: Supine to Sit     Supine to sit: Min guard;Min assist     General bed mobility comments: slow labored movement with increased time and c/o pain/discomfort right hip  Transfers Overall transfer level: Needs assistance Equipment used: Rolling walker (2 wheeled) Transfers: Sit to/from [employer] Sit to Stand: Min guard Stand pivot transfers: Min guard       General transfer comment: labored movement  Ambulation/Gait Ambulation/Gait assistance: Min Emergency planning/management officer (Feet): 80 Feet Assistive device: Rolling walker (2 wheeled) Gait Pattern/deviations: Decreased step length - right;Decreased stride length Gait  velocity: decreased   General Gait Details: slow slightly labored cadence without loss of balance, good return for right heel to toe stepping, limited secondary to right hip pain/fatigue  Stairs            Wheelchair Mobility    Modified Rankin (Stroke Patients Only)       Balance Overall balance assessment: Needs assistance Sitting-balance support: Feet supported;No upper extremity supported Sitting balance-Leahy Scale: Good     Standing balance support: Bilateral upper extremity supported;During functional activity Standing balance-Leahy Scale: Fair Standing balance comment: using RW                             Pertinent Vitals/Pain Pain Assessment: 0-10 Pain Score: 5  Pain Location: right hip Pain Descriptors / Indicators: Sore Pain Intervention(s): Limited activity within patient's tolerance;Monitored during session    Home Living Family/patient expects to be discharged to:: Private residence Living Arrangements: Spouse/significant other(girlfriend) Available Help at Discharge: Friend(s) Type of Home: House Home Access: Level entry     Home Layout: Two level;Able to live on main level with bedroom/bathroom;Other (Comment)(can sleep on couch on 1st floor per patient) Home Equipment: Cane - single point;Walker - 2 wheels;Shower seat      Prior Function Level of Independence: Independent with assistive device(s)         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[employer], drives     Hand Dominance        Extremity/Trunk Assessment   Upper Extremity Assessment Upper Extremity Assessment: Overall WFL for tasks assessed    Lower Extremity Assessment Lower Extremity Assessment: Generalized weakness;RLE deficits/detail;LLE deficits/detail RLE Deficits / Details: grossly -4/5 LLE Deficits / Details:  grossly 5/5       Communication   Communication: No difficulties  Cognition Arousal/Alertness: Awake/alert Behavior During Therapy: WFL for tasks  assessed/performed;Flat affect Overall Cognitive Status: No family/caregiver present to determine baseline cognitive functioning                                 General Comments: slow to respond to questions [facility]      General Comments      Exercises Total Joint Exercises Ankle Circles/Pumps: Supine;AROM;Right;5 reps;Strengthening Quad Sets: Supine;AROM;Strengthening;Right;5 reps Gluteal Sets: AROM;Supine;Strengthening;Both;5 reps Heel Slides: Supine;AROM;Strengthening;Right;5 reps   Assessment/Plan    PT Assessment Patient needs continued PT services  PT Problem List Decreased strength;Decreased activity tolerance;Decreased balance;Decreased mobility       PT Treatment Interventions Gait training;Stair training;Functional mobility training;Therapeutic activities;Therapeutic exercise    PT Goals (Current goals can be found in the Care Plan section)  Acute Rehab PT Goals Patient Stated Goal: return home PT Goal Formulation: With patient Time For Goal Achievement: 02/19/18 Potential to Achieve Goals: Good    Frequency 7X/week   Barriers to discharge        Co-evaluation               AM-PAC PT "6 Clicks" Mobility  Outcome Measure Help needed turning from your back to your side while in a flat bed without using bedrails?: A Little Help needed moving from lying on your back to sitting on the side of a flat bed without using bedrails?: A Little Help needed moving to and from a bed to a chair (including a wheelchair)?: A Little Help needed standing up from a chair using your arms (e.g., wheelchair or bedside chair)?: A Little Help needed to walk in hospital room?: A Little Help needed climbing 3-5 steps with a railing? : A Little 6 Click Score: 18    End of Session   Activity Tolerance: Patient tolerated treatment well;Patient limited by fatigue Patient left: in chair;with call bell/phone within reach;with chair alarm set Nurse  Communication: Mobility status PT Visit Diagnosis: Unsteadiness on feet (R26.81);Other abnormalities of gait and mobility (R26.89);Muscle weakness (generalized) (M62.81)    Time: [id number] PT Time Calculation (min) (ACUTE ONLY): 35 min   Charges:   PT Evaluation $PT Eval Moderate Complexity: 1 Mod PT Treatments $Gait Training: 8-22 mins        12:21 PM, 02/12/18 Ocie BobJames Gredmarie Delange, MPT Physical Therapist with [facility] 336 [phone] office [phone] mobile phone

## 2018-02-13 MED ORDER — DOCUSATE SODIUM 100 MG PO CAPS: 100.0000 mg | ORAL_CAPSULE | Freq: Two times a day (BID) | ORAL | 0 refills | Status: DC

## 2018-02-13 MED ORDER — ASPIRIN 325 MG PO TBEC: 325.0000 mg | DELAYED_RELEASE_TABLET | Freq: Two times a day (BID) | ORAL | 0 refills | Status: DC

## 2018-02-13 MED ORDER — OXYCODONE-ACETAMINOPHEN 5-325 MG PO TABS: 1.0000 | ORAL_TABLET | ORAL | 0 refills | Status: DC | PRN

## 2018-02-13 NOTE — Progress Notes (Signed)
Physical Therapy Treatment Patient Details Name: Cameron Flynn MRN: [id number] DOB: 06-06-1965 Today's Date: 02/13/2018    History of Present Illness Cameron Flynn is a 53 y/o male s/p Right THA 02/11/18, with the diagnosis of right hip osteoarthritis      PT Comments    Patient requires less assistance for sitting up at bedside, increased endurance/distance for gait training with fair/good return for right heel to toe stepping, no loss of balance and mostly limited due to right hip pain.  Patient on room air during ambulation with O2 saturation averaging 88%, as low as 80%, but increased to WNL above 94% after sitting in chair while still on room air - RN/MD notified.  Patient tolerated sitting up in chair after therapy.  Patient will benefit from continued physical therapy in hospital and recommended venue below to increase strength, balance, endurance for safe ADLs and gait.   Follow Up Recommendations  Home health PT;Supervision for mobility/OOB;Supervision - Intermittent     Equipment Recommendations  None recommended by PT    Recommendations for Other Services       Precautions / Restrictions Precautions Precautions: Fall Restrictions Weight Bearing Restrictions: Yes RLE Weight Bearing: Weight bearing as tolerated    Mobility  Bed Mobility Overal bed mobility: Needs Assistance Bed Mobility: Supine to Sit     Supine to sit: Supervision     General bed mobility comments: increased time, slow labored movement due to right hip pain  Transfers Overall transfer level: Needs assistance Equipment used: Rolling walker (2 wheeled) Transfers: Sit to/from [employer];Stand Pivot Transfers Sit to Stand: Supervision Stand pivot transfers: Supervision       General transfer comment: slightly labored movement, increased RLE strength for sit to stands  Ambulation/Gait Ambulation/Gait assistance: Supervision Gait Distance (Feet): 150 Feet Assistive device: Rolling walker (2  wheeled) Gait Pattern/deviations: Decreased step length - right;Decreased stride length Gait velocity: decreased   General Gait Details: increased endurance/distance for ambulation with slightly labored cadence, no loss of balance, limited secondary to right hip pain   Stairs             Wheelchair Mobility    Modified Rankin (Stroke Patients Only)       Balance Overall balance assessment: Needs assistance Sitting-balance support: Feet supported;No upper extremity supported Sitting balance-Leahy Scale: Good     Standing balance support: Bilateral upper extremity supported;During functional activity Standing balance-Leahy Scale: Fair Standing balance comment: using RW                            Cognition Arousal/Alertness: Awake/alert Behavior During Therapy: WFL for tasks assessed/performed Overall Cognitive Status: Within Functional Limits for tasks assessed                                 General Comments: slow to respond to questions possibly due to [facility]      Exercises Total Joint Exercises Ankle Circles/Pumps: Supine;AROM;Right;5 reps;Strengthening Quad Sets: Supine;AROM;Strengthening;Right;5 reps Heel Slides: Supine;AROM;Strengthening;Right;5 reps    General Comments        Pertinent Vitals/Pain Pain Assessment: 0-10 Pain Score: 7  Pain Location: right hip Pain Descriptors / Indicators: Sore Pain Intervention(s): Limited activity within patient's tolerance;Monitored during session    Home Living                      Prior Function  PT Goals (current goals can now be found in the care plan section) Acute Rehab PT Goals Patient Stated Goal: return home PT Goal Formulation: With patient Time For Goal Achievement: 02/19/18 Potential to Achieve Goals: Good Progress towards PT goals: Progressing toward goals    Frequency    7X/week      PT Plan Current plan remains appropriate     Co-evaluation              AM-PAC PT "6 Clicks" Mobility   Outcome Measure  Help needed turning from your back to your side while in a flat bed without using bedrails?: None Help needed moving from lying on your back to sitting on the side of a flat bed without using bedrails?: None Help needed moving to and from a bed to a chair (including a wheelchair)?: A Little Help needed standing up from a chair using your arms (e.g., wheelchair or bedside chair)?: A Little Help needed to walk in hospital room?: A Little Help needed climbing 3-5 steps with a railing? : A Little 6 Click Score: 20    End of Session   Activity Tolerance: Patient tolerated treatment well;Patient limited by pain Patient left: in chair;with call bell/phone within reach;with chair alarm set Nurse Communication: Mobility status PT Visit Diagnosis: Unsteadiness on feet (R26.81);Other abnormalities of gait and mobility (R26.89);Muscle weakness (generalized) (M62.81)     Time: [id number] PT Time Calculation (min) (ACUTE ONLY): 26 min  Charges:  $Gait Training: 8-22 mins $Therapeutic Exercise: 8-22 mins                     11:49 AM, 02/13/18 Cameron BobJames Calbert Flynn, MPT Physical Therapist with [facility] 336 [phone] office [phone] mobile phone

## 2018-02-13 NOTE — Progress Notes (Signed)
IV removed, WNL. D/C instructions given to pt. Verbalized understanding. Pt girlfriend at bedside to transport home.

## 2018-02-13 NOTE — Discharge Summary (Signed)
Physician Discharge Summary  Patient ID: Cameron Flynn MRN: [id number] DOB/AGE: 05-27-1965 53 y.o.  Admit date: 02/11/2018 Discharge date: 02/13/2018  Admission Diagnoses: Avascular necrosis right hip  Discharge Diagnoses: Avascular necrosis right hip  Discharged Condition: Stable Procedure: Right total hip Hospital Course: Unremarkable hospital course The patient came in on February 11 had an uncomplicated right total hip with a direct lateral approach we used a [place] total hip system He tolerated physical therapy well He was afebrile had a stable wound labs were good we allowed him to be discharged on February 13      Discharge Exam: Blood pressure (!) 156/75, pulse 62, temperature 98.5 F (36.9 C), temperature source Oral, resp. rate 14, SpO2 96 %.   Disposition: Discharge disposition: 01-Home or Self Care       Discharge Instructions    Call MD / Call 911   Complete by:  As directed    If you experience chest pain or shortness of breath, CALL 911 and be transported to the hospital emergency room.  If you develope a fever above 101 F, pus (white drainage) or increased drainage or redness at the wound, or calf pain, call your surgeon's office.   Change dressing   Complete by:  As directed    You may change your dressing on Tuesday , then change the dressing daily with sterile 4 x 4 inch gauze dressing and paper tape.  You may clean the incision with alcohol prior to redressing   Constipation Prevention   Complete by:  As directed    Drink plenty of fluids.  Prune juice may be helpful.  You may use a stool softener, such as Colace (over the counter) 100 mg twice a day.  Use MiraLax (over the counter) for constipation as needed.   Diet - low sodium heart healthy   Complete by:  As directed    Follow the hip precautions as taught in Physical Therapy   Complete by:  As directed    Increase activity slowly as tolerated   Complete by:  As directed    TED hose    Complete by:  As directed    Use stockings (TED hose) for 2 weeks on both leg(s).  You may remove them at night for sleeping.     Allergies as of 02/13/2018   No Known Allergies     Medication List    STOP taking these medications   ibuprofen 200 MG tablet Commonly known as:  ADVIL,MOTRIN     TAKE these medications   amLODipine 5 MG tablet Commonly known as:  NORVASC Take 1 tablet (5 mg total) by mouth daily.   aspirin 325 MG EC tablet Take 1 tablet (325 mg total) by mouth 2 (two) times daily.   atorvastatin 20 MG tablet Commonly known as:  LIPITOR Take 1 tablet (20 mg total) by mouth daily.   docusate sodium 100 MG capsule Commonly known as:  COLACE Take 1 capsule (100 mg total) by mouth 2 (two) times daily.   lisinopril 40 MG tablet Commonly known as:  PRINIVIL,ZESTRIL Take 1 tablet (40 mg total) by mouth daily.   oxyCODONE-acetaminophen 5-325 MG tablet Commonly known as:  PERCOCET Take 1 tablet by mouth every 4 (four) hours as needed for severe pain.            Durable Medical Equipment  (From admission, onward)         Start     Ordered   02/11/18 1612  DME 3 n 1  Once     02/11/18 1612   02/11/18 1612  DME Bedside commode  Once    Question:  Patient needs a bedside commode to treat with the following condition  Answer:  Status post total hip replacement, right   02/11/18 1612   02/11/18 1612  DME Walker rolling  Once    Question:  Patient needs a walker to treat with the following condition  Answer:  Status post total hip replacement, right   02/11/18 1612           Discharge Care Instructions  (From admission, onward)         Start     Ordered   02/13/18 0000  Change dressing    Comments:  You may change your dressing on Tuesday , then change the dressing daily with sterile 4 x 4 inch gauze dressing and paper tape.  You may clean the incision with alcohol prior to redressing   02/13/18 0747         Follow-up Information    Vickki Hearing, MD Follow up.   Specialties:  Orthopedic Surgery, Radiology Contact information: [address] [place] [address] [phone]           Signed: Fuller Canada 02/13/2018, 7:47 AM

## 2018-02-13 NOTE — Progress Notes (Signed)
Patient ID: Cameron Flynn, male   DOB: April 12, 1965, 53 y.o.   MRN: [id number] BP (!) 156/75 (BP Location: Right Arm)   Pulse 62   Temp 98.5 F (36.9 C) (Oral)   Resp 14   SpO2 96%   CBC Latest Ref Rng & Units 02/12/2018 02/07/2018  WBC 4.0 - 10.5 K/uL 9.6 9.8  Hemoglobin 13.0 - 17.0 g/dL 12.3(L) 14.7  Hematocrit 39.0 - 52.0 % 37.6(L) 43.8  Platelets 150 - 400 K/uL 207 285   BMP Latest Ref Rng & Units 02/12/2018 02/07/2018 10/10/2017  Glucose 70 - 99 mg/dL 170(Y) 96 174(B)  BUN 6 - 20 mg/dL 9 11 10   Creatinine 0.61 - 1.24 mg/dL 4.49 6.75 9.16  Sodium 135 - 145 mmol/L 135 134(L) 137  Potassium 3.5 - 5.1 mmol/L 3.7 4.5 4.3  Chloride 98 - 111 mmol/L 102 104 102  CO2 22 - 32 mmol/L 25 20(L) 25  Calcium 8.9 - 10.3 mg/dL 8.2(L) 8.8(L) 9.1    Vitals look stable labs look good plan discharge today

## 2018-02-17 ENCOUNTER — Telehealth: Payer: Self-pay | Admitting: Orthopedic Surgery

## 2018-02-17 NOTE — Telephone Encounter (Signed)
Fenton Malling, PT from [facility] states she needs verbal orders for this patient.  They are monitoring Mr. Nolden after his hip replacement  .  Please call Shamrya at [phone]  Thanks

## 2018-02-17 NOTE — Telephone Encounter (Signed)
Thanks. I did not see this on my review. I have called with verbal.

## 2018-02-17 NOTE — Telephone Encounter (Signed)
Can not give verbal, her mail box is full.

## 2018-02-17 NOTE — Telephone Encounter (Signed)
Did you order this

## 2018-02-17 NOTE — Telephone Encounter (Signed)
Face-to-face encounter (required for Medicare/Medicaid patients) (Order [id number])  Nursing  Date: 02/13/2018 Department: [facility] Released By/Authorizing: Vickki Hearing, MD (auto-released)   Vickki Hearing, MD NPI: [id number]    Patient Information   Patient Name Cameron Flynn, Cameron Flynn Sex Male DOB 1965/10/04 SSN [id number]  Order Information   Order Date/Time Release Date/Time Start Date/Time End Date/Time  02/13/18 09:16 AM 02/13/18 09:16 AM 02/13/18 09:16 AM 02/13/18 09:16 AM  Order History  Inpatient  Date/Time Action Taken User Additional Information  02/13/18 0916 Release Vickki Hearing, MD (auto-released) From Order: [id number]  02/13/18 0916 Complete Vickki Hearing, MD   Order Questions   Question Answer Comment  The encounter with the patient was in whole, or in part, for the following medical condition, which is the primary reason for home health care right hip replacement   I certify that, based on my findings, the following services are medically necessary home health services Physical therapy   Reason for Medically Necessary Home Health Services Therapy- Gait Training, Transfer Training and Stair Training   My clinical findings support the need for the above services Pain interferes with ambulation/mobility   Further, I certify that my clinical findings support that this patient is homebound due to: Ambulates short distances less than 300 feet       Comments   I Fuller Canada certify that this patient is under my care and that I, or a nurse practitioner or physician's assistant working with me, had a face-to-face encounter that meets the physician face-to-face encounter requirements with this patient on 02/13/2018. The encounter with the patient was in whole, or in part for the following medical condition(s) which is the primary reason for home health care (List medical condition): right hip replacement      Collection  Information   Completion Info   User Date/Time  Vickki Hearing, MD 02/13/18 [phone]

## 2018-02-18 ENCOUNTER — Encounter: Payer: Self-pay | Admitting: Physician Assistant

## 2018-02-18 ENCOUNTER — Ambulatory Visit: Payer: Self-pay | Admitting: Physician Assistant

## 2018-02-18 VITALS — BP 125/71 | HR 90 | Temp 97.9°F | Ht 68.5 in | Wt 176.5 lb

## 2018-02-18 DIAGNOSIS — I700 Atherosclerosis of aorta: Secondary | ICD-10-CM

## 2018-02-18 DIAGNOSIS — F17200 Nicotine dependence, unspecified, uncomplicated: Secondary | ICD-10-CM

## 2018-02-18 DIAGNOSIS — I10 Essential (primary) hypertension: Secondary | ICD-10-CM

## 2018-02-18 DIAGNOSIS — F1090 Alcohol use, unspecified, uncomplicated: Secondary | ICD-10-CM

## 2018-02-18 DIAGNOSIS — E785 Hyperlipidemia, unspecified: Secondary | ICD-10-CM

## 2018-02-18 DIAGNOSIS — Z789 Other specified health status: Secondary | ICD-10-CM

## 2018-02-18 NOTE — Progress Notes (Signed)
BP 125/71 (BP Location: Right Arm, Patient Position: Sitting, Cuff Size: Normal)   Pulse 90   Temp 97.9 F (36.6 C)   Ht 5' 8.5" (1.74 m)   Wt 176 lb 8 oz (80.1 kg)   SpO2 98%   BMI 26.45 kg/m    Subjective:    Patient ID: Cameron Flynn, male    DOB: 28-Jul-1965, 53 y.o.   MRN: [id number]  HPI: Cameron Flynn is a 53 y.o. male presenting on 02/18/2018 for Hypertension   HPI   Pt was discharged from the hospital on 2/13 after undergoing R THA for treatment of avascular necrosis.   He says "it hurts like hell" but then says it's getting better.  He is doing PT.    Pt is still smoking and drinking a 6 pack/day.    Relevant past medical, surgical, family and social history reviewed and updated as indicated. Interim medical history since our last visit reviewed. Allergies and medications reviewed and updated.   Current Outpatient Medications:  .  amLODipine (NORVASC) 5 MG tablet, Take 1 tablet (5 mg total) by mouth daily., Disp: 30 tablet, Rfl: 3 .  aspirin EC 325 MG EC tablet, Take 1 tablet (325 mg total) by mouth 2 (two) times daily., Disp: 30 tablet, Rfl: 0 .  atorvastatin (LIPITOR) 20 MG tablet, Take 1 tablet (20 mg total) by mouth daily., Disp: 90 tablet, Rfl: 1 .  docusate sodium (COLACE) 100 MG capsule, Take 1 capsule (100 mg total) by mouth 2 (two) times daily., Disp: 10 capsule, Rfl: 0 .  lisinopril (PRINIVIL,ZESTRIL) 40 MG tablet, Take 1 tablet (40 mg total) by mouth daily., Disp: 90 tablet, Rfl: 1 .  oxyCODONE-acetaminophen (PERCOCET) 5-325 MG tablet, Take 1 tablet by mouth every 4 (four) hours as needed for severe pain., Disp: 30 tablet, Rfl: 0   Review of Systems  Constitutional: Negative for appetite change, chills, diaphoresis, fatigue, fever and unexpected weight change.  HENT: Positive for hearing loss. Negative for congestion, dental problem, drooling, ear pain, facial swelling, mouth sores, sneezing, sore throat, trouble swallowing and voice change.   Eyes:  Negative for pain, discharge, redness, itching and visual disturbance.  Respiratory: Negative for cough, choking, shortness of breath and wheezing.   Cardiovascular: Negative for chest pain, palpitations and leg swelling.  Gastrointestinal: Negative for abdominal pain, blood in stool, constipation, diarrhea and vomiting.  Endocrine: Negative for cold intolerance, heat intolerance and polydipsia.  Genitourinary: Negative for decreased urine volume, dysuria and hematuria.  Musculoskeletal: Negative for arthralgias, back pain and gait problem.  Skin: Negative for rash.  Allergic/Immunologic: Negative for environmental allergies.  Neurological: Negative for seizures, syncope, light-headedness and headaches.  Hematological: Negative for adenopathy.  Psychiatric/Behavioral: Negative for agitation, dysphoric mood and suicidal ideas. The patient is not nervous/anxious.     Per HPI unless specifically indicated above     Objective:    BP 125/71 (BP Location: Right Arm, Patient Position: Sitting, Cuff Size: Normal)   Pulse 90   Temp 97.9 F (36.6 C)   Ht 5' 8.5" (1.74 m)   Wt 176 lb 8 oz (80.1 kg)   SpO2 98%   BMI 26.45 kg/m   Wt Readings from Last 3 Encounters:  02/18/18 176 lb 8 oz (80.1 kg)  02/07/18 180 lb (81.6 kg)  01/17/18 180 lb (81.6 kg)    Physical Exam Vitals signs reviewed.  Constitutional:      Appearance: He is well-developed.  HENT:     Head: Normocephalic  and atraumatic.  Neck:     Musculoskeletal: Neck supple.  Cardiovascular:     Rate and Rhythm: Normal rate and regular rhythm.  Pulmonary:     Effort: Pulmonary effort is normal.     Breath sounds: Normal breath sounds. No wheezing.  Abdominal:     General: Bowel sounds are normal.     Palpations: Abdomen is soft.     Tenderness: There is no abdominal tenderness.  Musculoskeletal:     Right lower leg: No edema.     Left lower leg: No edema.  Lymphadenopathy:     Cervical: No cervical adenopathy.  Skin:     General: Skin is warm and dry.  Neurological:     Mental Status: He is alert and oriented to person, place, and time.  Psychiatric:        Behavior: Behavior normal.         Assessment & Plan:    Encounter Diagnoses  Name Primary?  . Essential hypertension Yes  . Hyperlipidemia, unspecified hyperlipidemia type   . Tobacco use disorder   . Heavy alcohol use   . Aortic atherosclerosis (HCC)     -pt to get fasting labs done to recheck lipids. -counseled pt again on smoking cessation and limiting etoh to no more than 2 drinks/day -no medication changes today. -pt to continue with orthopedics for his hip follow-up -pt to follow up here 3 months.  RTO sooner prn

## 2018-02-21 ENCOUNTER — Other Ambulatory Visit (HOSPITAL_COMMUNITY)
Admission: RE | Admit: 2018-02-21 | Discharge: 2018-02-21 | Disposition: A | Discharge status: Home / Self Care | Payer: Self-pay | Source: Ambulatory Visit | Attending: Physician Assistant | Admitting: Physician Assistant

## 2018-02-21 DIAGNOSIS — E785 Hyperlipidemia, unspecified: Secondary | ICD-10-CM

## 2018-02-21 DIAGNOSIS — I10 Essential (primary) hypertension: Secondary | ICD-10-CM

## 2018-02-21 LAB — COMPREHENSIVE METABOLIC PANEL
ALK PHOS: 107 U/L (ref 38–126)
ALT: 51 U/L — ABNORMAL HIGH (ref 0–44)
AST: 41 U/L (ref 15–41)
Albumin: 3.7 g/dL (ref 3.5–5.0)
Anion gap: 8 (ref 5–15)
BILIRUBIN TOTAL: 0.7 mg/dL (ref 0.3–1.2)
BUN: 12 mg/dL (ref 6–20)
CALCIUM: 8.7 mg/dL — AB (ref 8.9–10.3)
CO2: 23 mmol/L (ref 22–32)
Chloride: 100 mmol/L (ref 98–111)
Creatinine, Ser: 0.79 mg/dL (ref 0.61–1.24)
GFR calc Af Amer: >60 mL/min (ref >60)
GFR calc non Af Amer: >60 mL/min (ref >60)
Glucose, Bld: 109 mg/dL — ABNORMAL HIGH (ref 70–99)
Potassium: 4.3 mmol/L (ref 3.5–5.1)
Sodium: 131 mmol/L — ABNORMAL LOW (ref 135–145)
Total Protein: 7.4 g/dL (ref 6.5–8.1)

## 2018-02-21 LAB — LIPID PANEL
CHOL/HDL RATIO: 3.4 ratio
CHOLESTEROL: 129 mg/dL (ref 0–200)
HDL: 38 mg/dL — ABNORMAL LOW (ref >40)
LDL Cholesterol: 76 mg/dL (ref 0–99)
Triglycerides: 77 mg/dL (ref <150)
VLDL: 15 mg/dL (ref 0–40)

## 2018-02-25 ENCOUNTER — Other Ambulatory Visit: Payer: Self-pay | Admitting: Physician Assistant

## 2018-02-25 MED ORDER — ATORVASTATIN CALCIUM 20 MG PO TABS: 20.0000 mg | ORAL_TABLET | Freq: Every day | ORAL | 1 refills | Status: DC

## 2018-02-26 ENCOUNTER — Ambulatory Visit (INDEPENDENT_AMBULATORY_CARE_PROVIDER_SITE_OTHER): Payer: Self-pay | Admitting: Orthopedic Surgery

## 2018-02-26 VITALS — BP 116/64 | HR 73 | Ht 68.0 in | Wt 176.0 lb

## 2018-02-26 DIAGNOSIS — Z96641 Presence of right artificial hip joint: Secondary | ICD-10-CM

## 2018-02-26 MED ORDER — HYDROCODONE-ACETAMINOPHEN 7.5-325 MG PO TABS: 1.0000 | ORAL_TABLET | Freq: Three times a day (TID) | ORAL | 0 refills | Status: DC | PRN

## 2018-02-26 NOTE — Progress Notes (Signed)
POSTOP VISIT  POD # 15  Chief Complaint  Patient presents with  . Follow-up    Recheck on right hip, DOS 02-11-18.   No Known Allergies  02/11/2018  10:19 AM  PATIENT:  Cameron Flynn Companion  53 y.o. male  PRE-OPERATIVE DIAGNOSIS:  right hip osteoarthritis  POST-OPERATIVE DIAGNOSIS:  right hip osteoarthritis  PROCEDURE:  Procedure(s) with comments: TOTAL HIP ARTHROPLASTY (Right) - per office request, letter mailed to patient with PAT information--27130  DEPUY PINNACLE GRIPTION CUP 54 32 HEAD +5 NECK HO TRILOCK STEM SIZE 5   SURGEON:  Surgeon(s) and Role:    Vickki Hearing, MD - Primary    Encounter Diagnosis  Name Primary?  . 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 HYDROcodone-acetaminophen (NORCO) 7.5-325 MG tablet    Sig: Take 1 tablet by mouth every 8 (eight) hours as needed for moderate pain.    Dispense:  42 tablet    Refill:  0  ,FU)  Recommend continue progressive physical therapy progress to a cane as tolerated  Follow-up in 4 weeks

## 2018-03-11 ENCOUNTER — Other Ambulatory Visit: Payer: Self-pay | Admitting: Physician Assistant

## 2018-03-11 DIAGNOSIS — D649 Anemia, unspecified: Secondary | ICD-10-CM

## 2018-03-11 DIAGNOSIS — E785 Hyperlipidemia, unspecified: Secondary | ICD-10-CM

## 2018-03-11 DIAGNOSIS — I10 Essential (primary) hypertension: Secondary | ICD-10-CM

## 2018-03-26 ENCOUNTER — Telehealth (INDEPENDENT_AMBULATORY_CARE_PROVIDER_SITE_OTHER): Payer: Self-pay | Admitting: Orthopedic Surgery

## 2018-03-26 ENCOUNTER — Other Ambulatory Visit: Payer: Self-pay

## 2018-03-26 DIAGNOSIS — Z96641 Presence of right artificial hip joint: Secondary | ICD-10-CM

## 2018-03-26 MED ORDER — HYDROCODONE-ACETAMINOPHEN 7.5-325 MG PO TABS: 1.0000 | ORAL_TABLET | Freq: Three times a day (TID) | ORAL | 0 refills | Status: AC | PRN

## 2018-03-26 NOTE — Addendum Note (Signed)
Addended by: Vickki Hearing on: 03/26/2018 02:06 PM   Modules accepted: Orders

## 2018-03-26 NOTE — Progress Notes (Addendum)
Virtual Visit via Telephone Note  I connected with Cameron Flynn on 03/26/18 at 10:40 AM EDT by telephone and verified that I am speaking with the correct person using two identifiers.   I discussed the limitations, risks, security and privacy concerns of performing an evaluation and management service by telephone and the availability of in person appointments. I also discussed with the patient that there may be a patient responsible charge related to this service. The patient expressed understanding and agreed to proceed.    I discussed the assessment and treatment plan with the patient. The patient was provided an opportunity to ask questions and all were answered. The patient agreed with the plan and demonstrated an understanding of the instructions.   The patient was advised to call back or seek an in-person evaluation if the symptoms worsen or if the condition fails to improve as anticipated.  I provided 8 minutes of non-face-to-face time during this encounter.   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  . 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  Be careful for uneven ground and make sure his workstation is clear of any debris  We will follow-up first week of May virtual or office visit depending on COVID-19 restrictions

## 2018-05-07 ENCOUNTER — Telehealth: Payer: Self-pay | Admitting: Orthopedic Surgery

## 2018-05-07 ENCOUNTER — Ambulatory Visit: Payer: Self-pay | Admitting: Orthopedic Surgery

## 2018-05-07 NOTE — Telephone Encounter (Signed)
Called patient regarding today's appointment (05/07/18) for follow up from right total hip surgery 02/11/18; states he would rather wait till a later date for his visit, due to covid-19 restrictions, or have it done virtually. Please advise.

## 2018-05-07 NOTE — Telephone Encounter (Signed)
2 months

## 2018-05-12 NOTE — Telephone Encounter (Signed)
Done/patient aware. 

## 2018-05-27 ENCOUNTER — Ambulatory Visit: Payer: Self-pay | Admitting: Physician Assistant

## 2018-05-27 DIAGNOSIS — Z789 Other specified health status: Secondary | ICD-10-CM

## 2018-05-27 DIAGNOSIS — I10 Essential (primary) hypertension: Secondary | ICD-10-CM

## 2018-05-27 DIAGNOSIS — D649 Anemia, unspecified: Secondary | ICD-10-CM

## 2018-05-27 DIAGNOSIS — E785 Hyperlipidemia, unspecified: Secondary | ICD-10-CM

## 2018-05-27 DIAGNOSIS — F1090 Alcohol use, unspecified, uncomplicated: Secondary | ICD-10-CM

## 2018-05-27 DIAGNOSIS — F17200 Nicotine dependence, unspecified, uncomplicated: Secondary | ICD-10-CM

## 2018-05-27 NOTE — Progress Notes (Signed)
   There were no vitals taken for this visit.   Subjective:    Patient ID: Cameron Flynn, male    DOB: November 15, 1965, 53 y.o.   MRN: [id number]  HPI: Cameron Flynn is a 53 y.o. male presenting on 05/27/2018 for No chief complaint on file.   HPI   This is a telemendicine visit due to coronavirus pandemic.  appointemnt is via telephone as pt does not have a smartphone  I connected with  Cameron Flynn on 05/27/18  by a video enabled telemedicine application and verified that I am speaking with the correct person using two identifiers.   I discussed the limitations of evaluation and management by telemedicine. The patient expressed understanding and agreed to proceed.  Pt is doing well and has no complaints.   He is still smoking.  He denies fever, new cough or sob  Relevant past medical, surgical, family and social history reviewed and updated as indicated. Interim medical history since our last visit reviewed. Allergies and medications reviewed and updated.   Current Outpatient Medications:  .  amLODipine (NORVASC) 5 MG tablet, Take 1 tablet (5 mg total) by mouth daily., Disp: 30 tablet, Rfl: 3 .  aspirin EC 325 MG EC tablet, Take 1 tablet (325 mg total) by mouth 2 (two) times daily., Disp: 30 tablet, Rfl: 0 .  atorvastatin (LIPITOR) 20 MG tablet, Take 1 tablet (20 mg total) by mouth daily., Disp: 90 tablet, Rfl: 1 .  docusate sodium (COLACE) 100 MG capsule, Take 1 capsule (100 mg total) by mouth 2 (two) times daily., Disp: 10 capsule, Rfl: 0 .  lisinopril (PRINIVIL,ZESTRIL) 40 MG tablet, Take 1 tablet (40 mg total) by mouth daily., Disp: 90 tablet, Rfl: 1  Review of Systems  Per HPI unless specifically indicated above     Objective:    There were no vitals taken for this visit.  Wt Readings from Last 3 Encounters:  02/26/18 176 lb (79.8 kg)  02/18/18 176 lb 8 oz (80.1 kg)  02/07/18 180 lb (81.6 kg)    Physical Exam Pulmonary:     Effort: Pulmonary effort is normal. No  respiratory distress.  Neurological:     Mental Status: He is alert and oriented to person, place, and time.  Psychiatric:        Attention and Perception: Attention normal.        Speech: Speech normal.        Behavior: Behavior is cooperative.        Cognition and Memory: Cognition normal.         Assessment & Plan:    Encounter Diagnoses  Name Primary?  . Essential hypertension Yes  . Hyperlipidemia, unspecified hyperlipidemia type   . Anemia, unspecified type   . Tobacco use disorder   . Heavy alcohol use       -Pt to get fasting labs and he will be called with results. --no changes to medications today -encouraged pt to avoid etoh, smoking -pt to follow up in 3 months.  He is to contact office sooner prn

## 2018-06-02 ENCOUNTER — Encounter: Payer: Self-pay | Admitting: Physician Assistant

## 2018-07-02 ENCOUNTER — Other Ambulatory Visit: Payer: Self-pay

## 2018-07-02 ENCOUNTER — Ambulatory Visit (INDEPENDENT_AMBULATORY_CARE_PROVIDER_SITE_OTHER): Payer: Self-pay

## 2018-07-02 ENCOUNTER — Encounter: Payer: Self-pay | Admitting: Orthopedic Surgery

## 2018-07-02 ENCOUNTER — Other Ambulatory Visit: Payer: Self-pay | Admitting: Orthopedic Surgery

## 2018-07-02 ENCOUNTER — Ambulatory Visit (INDEPENDENT_AMBULATORY_CARE_PROVIDER_SITE_OTHER): Payer: Self-pay | Admitting: Orthopedic Surgery

## 2018-07-02 VITALS — BP 152/90 | HR 71 | Temp 98.1°F | Ht 68.0 in | Wt 176.0 lb

## 2018-07-02 DIAGNOSIS — Z96641 Presence of right artificial hip joint: Secondary | ICD-10-CM

## 2018-07-02 DIAGNOSIS — M87052 Idiopathic aseptic necrosis of left femur: Secondary | ICD-10-CM

## 2018-07-02 NOTE — Progress Notes (Signed)
NEW PROBLEM OFFICE VISIT  Chief Complaint  Patient presents with  . Routine Post Op    Rt Hip DOS 02/11/18  . Hip Pain    Lt hip pain getting worse    53 year old male had a right total hip he is doing well.  He complains of left hip and groin pain with difficulty walking, pain is a dull ache becoming worse also some lateral hip pain associated with difficulty walking   Review of Systems  All other systems reviewed and are negative.    Past Medical History:  Diagnosis Date  . HOH (hard of hearing)   . Hypercholesteremia   . Hypertension     Past Surgical History:  Procedure Laterality Date  . FINGER SURGERY Right age 62   index  . TOTAL HIP ARTHROPLASTY Right 02/11/2018   Procedure: TOTAL HIP ARTHROPLASTY;  Surgeon: Carole Civil, MD;  Location: [facility];  Service: Orthopedics;  Laterality: Right;  per office request, letter mailed to patient with PAT information    Family History  Problem Relation Age of Onset  . Cancer Father    Social History   Tobacco Use  . Smoking status: Current Every Day Smoker    Packs/day: 1.00    Years: 34.00    Pack years: 34.00    Types: Cigarettes  . Smokeless tobacco: Never Used  Substance Use Topics  . Alcohol use: Yes    Comment: 6 pack/day or more  . Drug use: Not Currently    Types: Marijuana    Comment: [employer] as a teen    No Known Allergies  Current Meds  Medication Sig  . amLODipine (NORVASC) 5 MG tablet Take 1 tablet (5 mg total) by mouth daily.  Marland Kitchen aspirin EC 325 MG EC tablet Take 1 tablet (325 mg total) by mouth 2 (two) times daily.  Marland Kitchen atorvastatin (LIPITOR) 20 MG tablet Take 1 tablet (20 mg total) by mouth daily.  Marland Kitchen lisinopril (PRINIVIL,ZESTRIL) 40 MG tablet Take 1 tablet (40 mg total) by mouth daily.    BP (!) 152/90   Pulse 71   Temp 98.1 F (36.7 C)   Ht 5\' 8"  (1.727 m)   Wt 176 lb (79.8 kg)   BMI 26.76 kg/m   Physical Exam Vitals signs and nursing note reviewed.  Constitutional:      Appearance:  Normal appearance.  Neurological:     Mental Status: He is alert and oriented to person, place, and time.  Psychiatric:        Mood and Affect: Mood normal.     Ortho Exam  Normal range of motion in the right hip without tenderness no instability muscle tone normal neurovascular exam is intact  Left hip painful range of motion decreased range of motion tenderness in the groin no instability muscle tone normal neurovascular exam intact MEDICAL DECISION SECTION  Xrays were done at [facility] left hip fairly severe and diffuse but without deterioration of the head the diffuse nature however lends itself to total hip replacement on the left  Encounter Diagnoses  Name Primary?  . 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 Avascular necrosis of bone of left hip (HCC)     PLAN: (Rx., injectx, surgery, frx, mri/ct) Left total hip  The procedure has been fully reviewed with the patient; The risks and benefits of surgery have been discussed and explained and understood. Alternative treatment has also been reviewed, questions were encouraged and answered. The postoperative plan is  also been reviewed.   No orders of the defined types were placed in this encounter.   Fuller CanadaStanley Boss Danielsen, MD  07/02/2018 12:27 PM

## 2018-07-09 NOTE — Patient Instructions (Signed)
Cameron Flynn  07/09/2018     @PREFPERIOPPHARMACY @   Your procedure is scheduled on  07/15/2018 .  [facility] to [facility] at  [facility]  A.M.  Call this number if you have problems the morning of surgery:  [phone]   Remember:  Do not eat or drink after midnight.                       Take these medicines the morning of surgery with A SIP OF WATER None    Do not wear jewelry, make-up or nail polish.  Do not wear lotions, powders, or perfumes, or deodorant.  Do not shave 48 hours prior to surgery.  Men may shave face and neck.  Do not bring valuables to the hospital.  [facility] any belongings or valuables.  Contacts, dentures or bridgework may not be worn into surgery.  Leave your suitcase in the car.  After surgery it may be brought to your room.  For patients admitted to the hospital, discharge time will be determined by your treatment team.  Patients discharged the day of surgery will not be allowed to drive home.   Name and phone number of your driver:   family Special instructions:  None  Please read over the following fact sheets that you were given. Pain Booklet, Coughing and Deep Breathing, Blood Transfusion Information, Lab Information, Total Joint Packet, MRSA Information, Surgical Site Infection Prevention, Anesthesia Post-op Instructions and Care and Recovery After Surgery       Total Hip Replacement, Anterior, Care After This sheet gives you information about how to care for yourself after your procedure. Your doctor may also give you more specific instructions. If you have problems or questions, contact your doctor. What can I expect after the procedure? After the procedure, it is common to have:  Pain.  Stiffness.  Discomfort. Follow these instructions at home: Medicines  Take over-the-counter and prescription medicines only as told by your doctor.  If you were prescribed a medicine to thin your blood  (anticoagulant), take it as told by your doctor. Surgery cut care   Follow instructions from your doctor about how to take care of your cut (incision) from surgery. Make sure you: ? Wash your hands with soap and water before you change your bandage (dressing). If you cannot use soap and water, use alcohol-based hand sanitizer. ? Change your bandage as told by your doctor. ? Leave stitches (sutures), skin glue, or skin tape (adhesive) strips in place. They may need to stay in place for 2 weeks or longer. If tape strips get loose and curl up, you may trim the loose edges. Do not remove tape strips completely unless your doctor tells you to do that.  Check your surgical cut every day for signs of infection. Check for: ? Redness, swelling, or pain. ? Fluid or blood. ? Warmth. ? Pus or a bad smell. Bathing  Do not take baths, swim, or use a hot tub until your doctor says it is okay.  Keep the bandage dry until your doctor says it can be removed. Managing pain, stiffness, and swelling   If directed, put ice on the hip area. ? Put ice in a plastic bag. ? Place a towel between your skin and the bag. ? Leave the ice on for 20 minutes, 2-3 times a day.  Move your toes often to avoid stiffness and to lessen  swelling.  Raise (elevate) your leg above the level of your heart while you are sitting or lying down. Activity  Rest as told by your doctor.  Do not sit for a long time without moving. Get up to take short walks every 1-2 hours. This is important. Ask for help if you feel weak or unsteady.  Do exercises as told by your doctor or physical therapist.  Use a walker, crutches, or a cane as told by your doctor. ? You may use your legs to support (bear) your body weight as told by your doctor. Follow instructions about how much weight you may safely support on your affected leg (weight-bearing restrictions). ? A physical therapist may show you how to get out of a bed and chair and how to  go up and down stairs. You will first do this with a walker, crutches, or a cane. Then you will do it without any of these devices. ? Once you are able to walk without a limp, you may stop using a walker, crutches, or cane.  Return to your normal activities as told by your doctor. Ask your doctor what activities are safe for you. Safety  To help prevent falls: ? Keep floors clear of objects you may trip over. ? Place items that you may need within easy reach.  Wear an apron or tool belt with pockets for carrying objects. This leaves your hands free to help with your balance. Driving  Do not drive or use heavy machinery while taking prescription pain medicine.  Ask your doctor when it is safe to drive. General instructions  Wear compression stockings as told by your doctor. These help to prevent blood clots and reduce swelling in your legs.  Keep doing breathing exercises as told by your doctor. This helps prevent lung infection.  If you are taking prescription pain medicine, take actions to prevent or treat constipation. Your doctor may suggest that you: ? Drink enough fluid to keep your pee (urine) pale yellow. ? Eat foods that are high in fiber. These include fresh fruits and vegetables, whole grains, and beans. ? Limit foods that are high in fat and sugar. These include fried or sweet foods. ? Take an over-the-counter or prescription medicine for constipation.  Do not use any products that have nicotine or tobacco in them, such as cigarettes and e-cigarettes. These can delay bone healing. If you need help quitting, ask your doctor.  Keep all follow-up visits as told by your doctor. This is important. Contact a doctor if:  You have a fever or chills.  You have a cough.  You feel short of breath.  Your medicine is not helping your pain.  You have any of these at or near your cut from surgery: ? Redness, swelling, or pain. ? Fluid or blood. ? An area that feels warm when  you touch it. ? Pus or a bad smell. Get help right away if:  You have very bad pain.  You have trouble breathing.  You have chest pain.  You have redness, swelling, pain, and warmth in your calf or leg. Summary  Follow instructions from your doctor about how to take care of your surgery cut (incision).  Do not take baths, swim, or use a hot tub until your doctor says it is okay.  Use crutches, a walker, or a cane as told by your doctor.  If you were prescribed a medicine to thin your blood (anticoagulant), take it as told by your doctor.  This information is not intended to replace advice given to you by your health care provider. Make sure you discuss any questions you have with your health care provider. Document Released: 04/03/2017 Document Revised: 01/10/2018 Document Reviewed: 04/03/2017 Elsevier Interactive Patient Education  2020 [employer]  General Anesthesia, Adult, Care After This sheet gives you information about how to care for yourself after your procedure. Your health care provider may also give you more specific instructions. If you have problems or questions, contact your health care provider. What can I expect after the procedure? After the procedure, the following side effects are common:  Pain or discomfort at the IV site.  Nausea.  Vomiting.  Sore throat.  Trouble concentrating.  Feeling cold or chills.  Weak or tired.  Sleepiness and fatigue.  Soreness and body aches. These side effects can affect parts of the body that were not involved in surgery. Follow these instructions at home:  For at least 24 hours after the procedure:  Have a responsible adult stay with you. It is important to have someone help care for you until you are awake and alert.  Rest as needed.  Do not: ? Participate in activities in which you could fall or become injured. ? Drive. ? Use heavy machinery. ? Drink alcohol. ? Take sleeping pills or medicines that  cause drowsiness. ? Make important decisions or sign legal documents. ? Take care of children on your own. Eating and drinking  Follow any instructions from your health care provider about eating or drinking restrictions.  When you feel hungry, start by eating small amounts of foods that are soft and easy to digest (bland), such as toast. Gradually return to your regular diet.  Drink enough fluid to keep your urine pale yellow.  If you vomit, rehydrate by drinking water, juice, or clear broth. General instructions  If you have sleep apnea, surgery and certain medicines can increase your risk for breathing problems. Follow instructions from your health care provider about wearing your sleep device: ? Anytime you are sleeping, including during daytime naps. ? While taking prescription pain medicines, sleeping medicines, or medicines that make you drowsy.  Return to your normal activities as told by your health care provider. Ask your health care provider what activities are safe for you.  Take over-the-counter and prescription medicines only as told by your health care provider.  If you smoke, do not smoke without supervision.  Keep all follow-up visits as told by your health care provider. This is important. Contact a health care provider if:  You have nausea or vomiting that does not get better with medicine.  You cannot eat or drink without vomiting.  You have pain that does not get better with medicine.  You are unable to pass urine.  You develop a skin rash.  You have a fever.  You have redness around your IV site that gets worse. Get help right away if:  You have difficulty breathing.  You have chest pain.  You have blood in your urine or stool, or you vomit blood. Summary  After the procedure, it is common to have a sore throat or nausea. It is also common to feel tired.  Have a responsible adult stay with you for the first 24 hours after general anesthesia. It  is important to have someone help care for you until you are awake and alert.  When you feel hungry, start by eating small amounts of foods that are soft and easy to digest (  bland), such as toast. Gradually return to your regular diet.  Drink enough fluid to keep your urine pale yellow.  Return to your normal activities as told by your health care provider. Ask your health care provider what activities are safe for you. This information is not intended to replace advice given to you by your health care provider. Make sure you discuss any questions you have with your health care provider. Document Released: 03/26/2000 Document Revised: 12/21/2016 Document Reviewed: 08/03/2016 Elsevier Patient Education  2020 [employer] How to Use Chlorhexidine for Bathing Chlorhexidine gluconate (CHG) is a germ-killing (antiseptic) solution that is used to clean the skin. It can get rid of the bacteria that normally live on the skin and can keep them away for about 24 hours. To clean your skin with CHG, you may be given:  A CHG solution to use in the shower or as part of a sponge bath.  A prepackaged cloth that contains CHG. Cleaning your skin with CHG may help lower the risk for infection:  While you are staying in the intensive care unit of the hospital.  If you have a vascular access, such as a central line, to provide short-term or long-term access to your veins.  If you have a catheter to drain urine from your bladder.  If you are on a ventilator. A ventilator is a machine that helps you breathe by moving air in and out of your lungs.  After surgery. What are the risks? Risks of using CHG include:  A skin reaction.  Hearing loss, if CHG gets in your ears.  Eye injury, if CHG gets in your eyes and is not rinsed out.  The CHG product catching fire. Make sure that you avoid smoking and flames after applying CHG to your skin. Do not use CHG:  If you have a chlorhexidine allergy or have  previously reacted to chlorhexidine.  On babies younger than 22 months of age. How to use CHG solution  Use CHG only as told by your health care provider, and follow the instructions on the label.  Use the full amount of CHG as directed. Usually, this is one bottle. During a shower Follow these steps when using CHG solution during a shower (unless your health care provider gives you different instructions): 1. Start the shower. 2. Use your normal soap and shampoo to wash your face and hair. 3. Turn off the shower or move out of the shower stream. 4. Pour the CHG onto a clean washcloth. Do not use any type of brush or rough-edged sponge. 5. Starting at your neck, lather your body down to your toes. Make sure you follow these instructions: ? If you will be having surgery, pay special attention to the part of your body where you will be having surgery. Scrub this area for at least 1 minute. ? Do not use CHG on your head or face. If the solution gets into your ears or eyes, rinse them well with water. ? Avoid your genital area. ? Avoid any areas of skin that have broken skin, cuts, or scrapes. ? Scrub your back and under your arms. Make sure to wash skin folds. 6. Let the lather sit on your skin for 1-2 minutes or as long as told by your health care provider. 7. Thoroughly rinse your entire body in the shower. Make sure that all body creases and crevices are rinsed well. 8. Dry off with a clean towel. Do not put any substances on your body  afterward-such as powder, lotion, or perfume-unless you are told to do so by your health care provider. Only use lotions that are recommended by the manufacturer. 9. Put on clean clothes or pajamas. 10. If it is the night before your surgery, sleep in clean sheets.  During a sponge bath Follow these steps when using CHG solution during a sponge bath (unless your health care provider gives you different instructions): 1. Use your normal soap and shampoo to  wash your face and hair. 2. Pour the CHG onto a clean washcloth. 3. Starting at your neck, lather your body down to your toes. Make sure you follow these instructions: ? If you will be having surgery, pay special attention to the part of your body where you will be having surgery. Scrub this area for at least 1 minute. ? Do not use CHG on your head or face. If the solution gets into your ears or eyes, rinse them well with water. ? Avoid your genital area. ? Avoid any areas of skin that have broken skin, cuts, or scrapes. ? Scrub your back and under your arms. Make sure to wash skin folds. 4. Let the lather sit on your skin for 1-2 minutes or as long as told by your health care provider. 5. Using a different clean, wet washcloth, thoroughly rinse your entire body. Make sure that all body creases and crevices are rinsed well. 6. Dry off with a clean towel. Do not put any substances on your body afterward-such as powder, lotion, or perfume-unless you are told to do so by your health care provider. Only use lotions that are recommended by the manufacturer. 7. Put on clean clothes or pajamas. 8. If it is the night before your surgery, sleep in clean sheets. How to use CHG prepackaged cloths  Only use CHG cloths as told by your health care provider, and follow the instructions on the label.  Use the CHG cloth on clean, dry skin.  Do not use the CHG cloth on your head or face unless your health care provider tells you to.  When washing with the CHG cloth: ? Avoid your genital area. ? Avoid any areas of skin that have broken skin, cuts, or scrapes. Before surgery Follow these steps when using a CHG cloth to clean before surgery (unless your health care provider gives you different instructions): 1. Using the CHG cloth, vigorously scrub the part of your body where you will be having surgery. Scrub using a back-and-forth motion for 3 minutes. The area on your body should be completely wet with CHG  when you are done scrubbing. 2. Do not rinse. Discard the cloth and let the area air-dry. Do not put any substances on the area afterward, such as powder, lotion, or perfume. 3. Put on clean clothes or pajamas. 4. If it is the night before your surgery, sleep in clean sheets.  For general bathing Follow these steps when using CHG cloths for general bathing (unless your health care provider gives you different instructions). 1. Use a separate CHG cloth for each area of your body. Make sure you wash between any folds of skin and between your fingers and toes. Wash your body in the following order, switching to a new cloth after each step: ? The front of your neck, shoulders, and chest. ? Both of your arms, under your arms, and your hands. ? Your stomach and groin area, avoiding the genitals. ? Your right leg and foot. ? Your left leg and foot. ?  The back of your neck, your back, and your buttocks. 2. Do not rinse. Discard the cloth and let the area air-dry. Do not put any substances on your body afterward-such as powder, lotion, or perfume-unless you are told to do so by your health care provider. Only use lotions that are recommended by the manufacturer. 3. Put on clean clothes or pajamas. Contact a health care provider if:  Your skin gets irritated after scrubbing.  You have questions about using your solution or cloth. Get help right away if:  Your eyes become very red or swollen.  Your eyes itch badly.  Your skin itches badly and is red or swollen.  Your hearing changes.  You have trouble seeing.  You have swelling or tingling in your mouth or throat.  You have trouble breathing.  You swallow any chlorhexidine. Summary  Chlorhexidine gluconate (CHG) is a germ-killing (antiseptic) solution that is used to clean the skin. Cleaning your skin with CHG may help to lower your risk for infection.  You may be given CHG to use for bathing. It may be in a bottle or in a prepackaged  cloth to use on your skin. Carefully follow your health care provider's instructions and the instructions on the product label.  Do not use CHG if you have a chlorhexidine allergy.  Contact your health care provider if your skin gets irritated after scrubbing. This information is not intended to replace advice given to you by your health care provider. Make sure you discuss any questions you have with your health care provider. Document Released: 09/12/2011 Document Revised: 03/06/2018 Document Reviewed: 11/15/2016 Elsevier Patient Education  2020 [employer].

## 2018-07-11 ENCOUNTER — Other Ambulatory Visit: Payer: Self-pay | Admitting: Physician Assistant

## 2018-07-11 ENCOUNTER — Encounter (HOSPITAL_COMMUNITY): Payer: Self-pay

## 2018-07-11 ENCOUNTER — Encounter (HOSPITAL_COMMUNITY)
Admission: RE | Admit: 2018-07-11 | Discharge: 2018-07-11 | Disposition: A | Discharge status: Home / Self Care | Payer: Self-pay | Source: Ambulatory Visit | Attending: Orthopedic Surgery | Admitting: Orthopedic Surgery

## 2018-07-11 ENCOUNTER — Other Ambulatory Visit: Payer: Self-pay

## 2018-07-11 ENCOUNTER — Other Ambulatory Visit (HOSPITAL_COMMUNITY)
Admission: RE | Admit: 2018-07-11 | Discharge: 2018-07-11 | Disposition: A | Discharge status: Home / Self Care | Payer: HRSA Program | Source: Ambulatory Visit | Attending: Orthopedic Surgery | Admitting: Orthopedic Surgery

## 2018-07-11 DIAGNOSIS — Z01812 Encounter for preprocedural laboratory examination: Secondary | ICD-10-CM | POA: Insufficient documentation

## 2018-07-11 DIAGNOSIS — Z1159 Encounter for screening for other viral diseases: Secondary | ICD-10-CM | POA: Diagnosis not present

## 2018-07-11 LAB — CBC WITH DIFFERENTIAL/PLATELET
Abs Immature Granulocytes: 0.03 10*3/uL (ref 0.00–0.07)
Basophils Absolute: 0.1 10*3/uL (ref 0.0–0.1)
Basophils Relative: 1 %
Eosinophils Absolute: 0.2 10*3/uL (ref 0.0–0.5)
Eosinophils Relative: 2 %
HCT: 46.8 % (ref 39.0–52.0)
Hemoglobin: 15.8 g/dL (ref 13.0–17.0)
Immature Granulocytes: 0 %
Lymphocytes Relative: 22 %
Lymphs Abs: 2.2 10*3/uL (ref 0.7–4.0)
MCH: 33.1 pg (ref 26.0–34.0)
MCHC: 33.8 g/dL (ref 30.0–36.0)
MCV: 98.1 fL (ref 80.0–100.0)
Monocytes Absolute: 0.9 10*3/uL (ref 0.1–1.0)
Monocytes Relative: 9 %
Neutro Abs: 6.4 10*3/uL (ref 1.7–7.7)
Neutrophils Relative %: 66 %
Platelets: 264 10*3/uL (ref 150–400)
RBC: 4.77 MIL/uL (ref 4.22–5.81)
RDW: 14 % (ref 11.5–15.5)
WBC: 9.8 10*3/uL (ref 4.0–10.5)
nRBC: 0 % (ref 0.0–0.2)

## 2018-07-11 LAB — BASIC METABOLIC PANEL
Anion gap: 11 (ref 5–15)
BUN: 8 mg/dL (ref 6–20)
CO2: 23 mmol/L (ref 22–32)
Calcium: 9.2 mg/dL (ref 8.9–10.3)
Chloride: 102 mmol/L (ref 98–111)
Creatinine, Ser: 0.86 mg/dL (ref 0.61–1.24)
GFR calc Af Amer: >60 mL/min (ref >60)
GFR calc non Af Amer: >60 mL/min (ref >60)
Glucose, Bld: 98 mg/dL (ref 70–99)
Potassium: 3.6 mmol/L (ref 3.5–5.1)
Sodium: 136 mmol/L (ref 135–145)

## 2018-07-11 LAB — PREPARE RBC (CROSSMATCH)

## 2018-07-12 LAB — SARS CORONAVIRUS 2 (TAT 6-24 HRS): SARS Coronavirus 2: NEGATIVE

## 2018-07-14 NOTE — H&P (Signed)
NEW PROBLEM OFFICE VISIT       Chief Complaint  Patient presents with  . Routine Post Op      Rt Hip DOS 02/11/18  . Hip Pain      Lt hip pain getting worse     53 year old male had a right total hip he is doing well.  He complains of left hip and groin pain with difficulty walking, pain is a dull ache becoming worse also some lateral hip pain associated with difficulty walking     Review of Systems  All other systems reviewed and are negative.           Past Medical History:  Diagnosis Date  . HOH (hard of hearing)    . Hypercholesteremia    . Hypertension            Past Surgical History:  Procedure Laterality Date  . FINGER SURGERY Right age 65    index  . TOTAL HIP ARTHROPLASTY Right 02/11/2018    Procedure: TOTAL HIP ARTHROPLASTY;  Surgeon: Carole Civil, MD;  Location: [facility];  Service: Orthopedics;  Laterality: Right;  per office request, letter mailed to patient with PAT information          Family History  Problem Relation Age of Onset  . Cancer Father     Social History         Tobacco Use  . Smoking status: Current Every Day Smoker      Packs/day: 1.00      Years: 34.00      Pack years: 34.00      Types: Cigarettes  . Smokeless tobacco: Never Used  Substance Use Topics  . Alcohol use: Yes      Comment: 6 pack/day or more  . Drug use: Not Currently      Types: Marijuana      Comment: MJ as a teen     No Known Allergies   Active Medications      Current Meds  Medication Sig  . amLODipine (NORVASC) 5 MG tablet Take 1 tablet (5 mg total) by mouth daily.  Marland Kitchen aspirin EC 325 MG EC tablet Take 1 tablet (325 mg total) by mouth 2 (two) times daily.  Marland Kitchen atorvastatin (LIPITOR) 20 MG tablet Take 1 tablet (20 mg total) by mouth daily.  Marland Kitchen lisinopril (PRINIVIL,ZESTRIL) 40 MG tablet Take 1 tablet (40 mg total) by mouth daily.       BP (!) 152/90   Pulse 71   Temp 98.1 F (36.7 C)   Ht 5\' 8"  (1.727 m)   Wt 176 lb (79.8 kg)   BMI 26.76 kg/m     Physical Exam Vitals signs and nursing note reviewed.     Cameron Flynn is well-developed well-nourished has medium body frame he is oriented x3 his mood is pleasant his affect is flat his gait shows no limp  Is good pulses bilaterally his sensation is intact he has no pathologic reflexes no abnormality on deep tendon reflexes and has overall normal coordination and balance   Ortho Exam   Normal range of motion in the right hip without tenderness no instability muscle tone normal neurovascular exam is intact   Left hip painful range of motion decreased range of motion tenderness in the groin no instability muscle tone normal neurovascular exam intact MEDICAL DECISION SECTION  Xrays were done at [facility] left hip fairly severe and diffuse but without deterioration of the head the diffuse  nature however lends itself to total hip replacement on the left    Diagnosis avascular necrosis left hip    PLAN: (Rx., injectx, surgery, frx, mri/ct) Left total hip   The procedure has been fully reviewed with the patient; The risks and benefits of surgery have been discussed and explained and understood. Alternative treatment has also been reviewed, questions were encouraged and answered. The postoperative plan is also been reviewed.     No orders of the defined types were placed in this encounter.    Cameron Flynn , MD

## 2018-07-15 ENCOUNTER — Inpatient Hospital Stay (HOSPITAL_COMMUNITY): Payer: Self-pay

## 2018-07-15 ENCOUNTER — Encounter (HOSPITAL_COMMUNITY): Payer: Self-pay | Admitting: *Deleted

## 2018-07-15 ENCOUNTER — Encounter (HOSPITAL_COMMUNITY)
Admission: RE | Disposition: A | Discharge status: Home Health | Payer: Self-pay | Source: Home / Self Care | Attending: Orthopedic Surgery

## 2018-07-15 ENCOUNTER — Inpatient Hospital Stay (HOSPITAL_COMMUNITY)
Admission: RE | Admit: 2018-07-15 | Discharge: 2018-07-16 | DRG: 470 | Disposition: A | Discharge status: Home Health | Payer: Self-pay | Attending: Orthopedic Surgery | Admitting: Orthopedic Surgery

## 2018-07-15 ENCOUNTER — Inpatient Hospital Stay (HOSPITAL_COMMUNITY): Payer: Self-pay | Admitting: Anesthesiology

## 2018-07-15 ENCOUNTER — Other Ambulatory Visit: Payer: Self-pay

## 2018-07-15 DIAGNOSIS — I10 Essential (primary) hypertension: Secondary | ICD-10-CM | POA: Diagnosis present

## 2018-07-15 DIAGNOSIS — M1612 Unilateral primary osteoarthritis, left hip: Principal | ICD-10-CM | POA: Diagnosis present

## 2018-07-15 DIAGNOSIS — M87052 Idiopathic aseptic necrosis of left femur: Secondary | ICD-10-CM

## 2018-07-15 DIAGNOSIS — M87059 Idiopathic aseptic necrosis of unspecified femur: Secondary | ICD-10-CM | POA: Diagnosis present

## 2018-07-15 DIAGNOSIS — F17210 Nicotine dependence, cigarettes, uncomplicated: Secondary | ICD-10-CM | POA: Diagnosis present

## 2018-07-15 DIAGNOSIS — M879 Osteonecrosis, unspecified: Secondary | ICD-10-CM | POA: Diagnosis present

## 2018-07-15 DIAGNOSIS — Z96642 Presence of left artificial hip joint: Secondary | ICD-10-CM

## 2018-07-15 DIAGNOSIS — Z96641 Presence of right artificial hip joint: Secondary | ICD-10-CM | POA: Diagnosis present

## 2018-07-15 HISTORY — PX: TOTAL HIP ARTHROPLASTY: SHX124

## 2018-07-15 SURGERY — ARTHROPLASTY, HIP, TOTAL,POSTERIOR APPROACH
Anesthesia: General | Site: Hip | Laterality: Left

## 2018-07-15 MED ORDER — METHOCARBAMOL 500 MG PO TABS: 500.0000 mg | ORAL_TABLET | Freq: Four times a day (QID) | ORAL | Status: DC | PRN

## 2018-07-15 MED ORDER — FENTANYL CITRATE (PF) 100 MCG/2ML IJ SOLN
INTRAMUSCULAR | Status: DC | PRN
  Administered 2018-07-15 (×2): 50 ug via INTRAVENOUS
  Administered 2018-07-15 (×3): 100 ug via INTRAVENOUS
  Administered 2018-07-15 (×2): 50 ug via INTRAVENOUS
  Administered 2018-07-15: 100 ug via INTRAVENOUS

## 2018-07-15 MED ORDER — BUPIVACAINE LIPOSOME 1.3 % IJ SUSP
INTRAMUSCULAR | Status: DC | PRN
  Administered 2018-07-15: 20 mL

## 2018-07-15 MED ORDER — DEXAMETHASONE SODIUM PHOSPHATE 10 MG/ML IJ SOLN
INTRAMUSCULAR | Status: DC | PRN
  Administered 2018-07-15 (×2): 5 mg via INTRAVENOUS

## 2018-07-15 MED ORDER — BUPIVACAINE-EPINEPHRINE (PF) 0.25% -1:200000 IJ SOLN
INTRAMUSCULAR | Status: AC
  Filled 2018-07-15: qty 30

## 2018-07-15 MED ORDER — CELECOXIB 400 MG PO CAPS
ORAL_CAPSULE | ORAL | Status: AC
  Filled 2018-07-15: qty 1

## 2018-07-15 MED ORDER — SODIUM CHLORIDE 0.9 % IR SOLN
Status: DC | PRN
  Administered 2018-07-15: 3000 mL

## 2018-07-15 MED ORDER — CEFAZOLIN SODIUM-DEXTROSE 2-4 GM/100ML-% IV SOLN
2.0000 g | INTRAVENOUS | Status: AC
  Administered 2018-07-15: 2 g via INTRAVENOUS
  Filled 2018-07-15: qty 100

## 2018-07-15 MED ORDER — SENNOSIDES-DOCUSATE SODIUM 8.6-50 MG PO TABS
1.0000 | ORAL_TABLET | Freq: Every evening | ORAL | Status: DC | PRN
  Administered 2018-07-15: 1 via ORAL
  Filled 2018-07-15: qty 1

## 2018-07-15 MED ORDER — PREGABALIN 50 MG PO CAPS
ORAL_CAPSULE | ORAL | Status: AC
  Filled 2018-07-15: qty 1

## 2018-07-15 MED ORDER — ASPIRIN EC 325 MG PO TBEC
325.0000 mg | DELAYED_RELEASE_TABLET | Freq: Every day | ORAL | Status: DC
  Administered 2018-07-16: 325 mg via ORAL
  Filled 2018-07-15: qty 1

## 2018-07-15 MED ORDER — DOCUSATE SODIUM 100 MG PO CAPS
100.0000 mg | ORAL_CAPSULE | Freq: Two times a day (BID) | ORAL | Status: DC
  Administered 2018-07-16: 100 mg via ORAL
  Filled 2018-07-15 (×2): qty 1

## 2018-07-15 MED ORDER — DEXAMETHASONE SODIUM PHOSPHATE 10 MG/ML IJ SOLN
INTRAMUSCULAR | Status: AC
  Filled 2018-07-15: qty 1

## 2018-07-15 MED ORDER — MIDAZOLAM HCL 2 MG/2ML IJ SOLN
INTRAMUSCULAR | Status: AC
  Filled 2018-07-15: qty 2

## 2018-07-15 MED ORDER — PROPOFOL 10 MG/ML IV BOLUS
INTRAVENOUS | Status: AC
  Filled 2018-07-15: qty 20

## 2018-07-15 MED ORDER — KETOROLAC TROMETHAMINE 30 MG/ML IJ SOLN
INTRAMUSCULAR | Status: AC
  Filled 2018-07-15: qty 1

## 2018-07-15 MED ORDER — ONDANSETRON HCL 4 MG/2ML IJ SOLN
INTRAMUSCULAR | Status: DC | PRN
  Administered 2018-07-15: 4 mg via INTRAVENOUS

## 2018-07-15 MED ORDER — MORPHINE SULFATE (PF) 2 MG/ML IV SOLN
0.5000 mg | INTRAVENOUS | Status: DC | PRN
  Administered 2018-07-15: 1 mg via INTRAVENOUS
  Filled 2018-07-15: qty 1

## 2018-07-15 MED ORDER — ONDANSETRON HCL 4 MG/2ML IJ SOLN
4.0000 mg | Freq: Four times a day (QID) | INTRAMUSCULAR | Status: DC
  Administered 2018-07-15 – 2018-07-16 (×4): 4 mg via INTRAVENOUS
  Filled 2018-07-15 (×2): qty 2

## 2018-07-15 MED ORDER — TRAMADOL HCL 50 MG PO TABS
50.0000 mg | ORAL_TABLET | Freq: Four times a day (QID) | ORAL | Status: DC
  Administered 2018-07-15 – 2018-07-16 (×4): 50 mg via ORAL
  Filled 2018-07-15 (×4): qty 1

## 2018-07-15 MED ORDER — SEVOFLURANE IN SOLN
RESPIRATORY_TRACT | Status: AC
  Filled 2018-07-15: qty 250

## 2018-07-15 MED ORDER — HYDROMORPHONE HCL 1 MG/ML IJ SOLN
INTRAMUSCULAR | Status: AC
  Filled 2018-07-15: qty 0.5

## 2018-07-15 MED ORDER — CEFAZOLIN SODIUM-DEXTROSE 2-4 GM/100ML-% IV SOLN
2.0000 g | Freq: Four times a day (QID) | INTRAVENOUS | Status: AC
  Administered 2018-07-15 (×2): 2 g via INTRAVENOUS
  Filled 2018-07-15 (×2): qty 100

## 2018-07-15 MED ORDER — LACTATED RINGERS IV SOLN
INTRAVENOUS | Status: DC
  Administered 2018-07-15: 1000 mL via INTRAVENOUS
  Administered 2018-07-15: 09:00:00 via INTRAVENOUS

## 2018-07-15 MED ORDER — ACETAMINOPHEN 325 MG PO TABS: 325.0000 mg | ORAL_TABLET | Freq: Four times a day (QID) | ORAL | Status: DC | PRN

## 2018-07-15 MED ORDER — METOCLOPRAMIDE HCL 10 MG PO TABS: 5.0000 mg | ORAL_TABLET | Freq: Three times a day (TID) | ORAL | Status: DC | PRN

## 2018-07-15 MED ORDER — EPHEDRINE 5 MG/ML INJ
INTRAVENOUS | Status: AC
  Filled 2018-07-15: qty 10

## 2018-07-15 MED ORDER — METOCLOPRAMIDE HCL 5 MG/ML IJ SOLN: 5.0000 mg | Freq: Three times a day (TID) | INTRAMUSCULAR | Status: DC | PRN

## 2018-07-15 MED ORDER — MENTHOL 3 MG MT LOZG: 1.0000 | LOZENGE | OROMUCOSAL | Status: DC | PRN

## 2018-07-15 MED ORDER — ALUM & MAG HYDROXIDE-SIMETH 200-200-20 MG/5ML PO SUSP: 30.0000 mL | ORAL | Status: DC | PRN

## 2018-07-15 MED ORDER — SUGAMMADEX SODIUM 200 MG/2ML IV SOLN
INTRAVENOUS | Status: DC | PRN
  Administered 2018-07-15: 200 mg via INTRAVENOUS

## 2018-07-15 MED ORDER — OXYCODONE HCL 5 MG PO TABS
5.0000 mg | ORAL_TABLET | ORAL | Status: DC
  Administered 2018-07-15: 5 mg via ORAL

## 2018-07-15 MED ORDER — PANTOPRAZOLE SODIUM 40 MG PO TBEC
40.0000 mg | DELAYED_RELEASE_TABLET | Freq: Every day | ORAL | Status: DC
  Administered 2018-07-15 – 2018-07-16 (×2): 40 mg via ORAL
  Filled 2018-07-15 (×2): qty 1

## 2018-07-15 MED ORDER — PHENYLEPHRINE 40 MCG/ML (10ML) SYRINGE FOR IV PUSH (FOR BLOOD PRESSURE SUPPORT)
PREFILLED_SYRINGE | INTRAVENOUS | Status: DC | PRN
  Administered 2018-07-15 (×2): 80 ug via INTRAVENOUS

## 2018-07-15 MED ORDER — TRANEXAMIC ACID-NACL 1000-0.7 MG/100ML-% IV SOLN
1000.0000 mg | INTRAVENOUS | Status: AC
  Administered 2018-07-15: 1000 mg via INTRAVENOUS
  Filled 2018-07-15: qty 100

## 2018-07-15 MED ORDER — ATORVASTATIN CALCIUM 20 MG PO TABS
20.0000 mg | ORAL_TABLET | Freq: Every day | ORAL | Status: DC
  Administered 2018-07-15 – 2018-07-16 (×2): 20 mg via ORAL
  Filled 2018-07-15 (×2): qty 1

## 2018-07-15 MED ORDER — HYDROCODONE-ACETAMINOPHEN 7.5-325 MG PO TABS
1.0000 | ORAL_TABLET | ORAL | Status: DC | PRN
  Administered 2018-07-15 – 2018-07-16 (×3): 1 via ORAL
  Filled 2018-07-15 (×3): qty 1

## 2018-07-15 MED ORDER — ONDANSETRON HCL 4 MG PO TABS: 4.0000 mg | ORAL_TABLET | Freq: Four times a day (QID) | ORAL | Status: DC | PRN

## 2018-07-15 MED ORDER — LISINOPRIL 10 MG PO TABS
40.0000 mg | ORAL_TABLET | Freq: Every day | ORAL | Status: DC
  Administered 2018-07-15 – 2018-07-16 (×2): 40 mg via ORAL
  Filled 2018-07-15 (×2): qty 4

## 2018-07-15 MED ORDER — ONDANSETRON HCL 4 MG/2ML IJ SOLN
INTRAMUSCULAR | Status: AC
  Filled 2018-07-15: qty 2

## 2018-07-15 MED ORDER — STERILE WATER FOR IRRIGATION IR SOLN
Status: DC | PRN
  Administered 2018-07-15: 2000 mL

## 2018-07-15 MED ORDER — 0.9 % SODIUM CHLORIDE (POUR BTL) OPTIME
TOPICAL | Status: DC | PRN
  Administered 2018-07-15: 08:00:00 1000 mL

## 2018-07-15 MED ORDER — METHOCARBAMOL 1000 MG/10ML IJ SOLN
500.0000 mg | Freq: Four times a day (QID) | INTRAVENOUS | Status: DC
  Administered 2018-07-15 – 2018-07-16 (×4): 500 mg via INTRAVENOUS
  Filled 2018-07-15 (×10): qty 5

## 2018-07-15 MED ORDER — ONDANSETRON HCL 4 MG/2ML IJ SOLN
4.0000 mg | Freq: Four times a day (QID) | INTRAMUSCULAR | Status: DC | PRN
  Filled 2018-07-15: qty 2

## 2018-07-15 MED ORDER — HYDROMORPHONE HCL 1 MG/ML IJ SOLN
0.5000 mg | INTRAMUSCULAR | Status: AC | PRN
  Administered 2018-07-15 (×4): 0.5 mg via INTRAVENOUS
  Filled 2018-07-15 (×3): qty 0.5

## 2018-07-15 MED ORDER — ROCURONIUM BROMIDE 10 MG/ML (PF) SYRINGE
PREFILLED_SYRINGE | INTRAVENOUS | Status: DC | PRN
  Administered 2018-07-15: 50 mg via INTRAVENOUS
  Administered 2018-07-15 (×2): 10 mg via INTRAVENOUS
  Administered 2018-07-15: 20 mg via INTRAVENOUS

## 2018-07-15 MED ORDER — FENTANYL CITRATE (PF) 100 MCG/2ML IJ SOLN
INTRAMUSCULAR | Status: AC
  Filled 2018-07-15: qty 2

## 2018-07-15 MED ORDER — SODIUM CHLORIDE 0.9 % IV SOLN
INTRAVENOUS | Status: AC
  Administered 2018-07-15 (×2): via INTRAVENOUS

## 2018-07-15 MED ORDER — DIPHENHYDRAMINE HCL 12.5 MG/5ML PO ELIX: 12.5000 mg | ORAL_SOLUTION | ORAL | Status: DC | PRN

## 2018-07-15 MED ORDER — DEXAMETHASONE SODIUM PHOSPHATE 10 MG/ML IJ SOLN
10.0000 mg | Freq: Once | INTRAMUSCULAR | Status: AC
  Administered 2018-07-16: 10 mg via INTRAVENOUS
  Filled 2018-07-15: qty 1

## 2018-07-15 MED ORDER — OXYCODONE HCL 5 MG PO TABS
ORAL_TABLET | ORAL | Status: AC
  Filled 2018-07-15: qty 1

## 2018-07-15 MED ORDER — PREGABALIN 50 MG PO CAPS
50.0000 mg | ORAL_CAPSULE | Freq: Three times a day (TID) | ORAL | Status: DC
  Administered 2018-07-15: 50 mg via ORAL

## 2018-07-15 MED ORDER — MIDAZOLAM HCL 5 MG/5ML IJ SOLN
INTRAMUSCULAR | Status: DC | PRN
  Administered 2018-07-15: 0.5 mg via INTRAVENOUS
  Administered 2018-07-15: 1.5 mg via INTRAVENOUS

## 2018-07-15 MED ORDER — CELECOXIB 100 MG PO CAPS
200.0000 mg | ORAL_CAPSULE | Freq: Every day | ORAL | Status: DC
  Administered 2018-07-15: 200 mg via ORAL
  Filled 2018-07-15: qty 2

## 2018-07-15 MED ORDER — BUPIVACAINE-EPINEPHRINE (PF) 0.25% -1:200000 IJ SOLN
INTRAMUSCULAR | Status: DC | PRN
  Administered 2018-07-15: 20 mL

## 2018-07-15 MED ORDER — LIDOCAINE HCL (PF) 2 % IJ SOLN
INTRAMUSCULAR | Status: DC | PRN
  Administered 2018-07-15: 60 mg via INTRADERMAL

## 2018-07-15 MED ORDER — BUPIVACAINE LIPOSOME 1.3 % IJ SUSP
INTRAMUSCULAR | Status: AC
  Filled 2018-07-15: qty 20

## 2018-07-15 MED ORDER — PHENOL 1.4 % MT LIQD: 1.0000 | OROMUCOSAL | Status: DC | PRN

## 2018-07-15 MED ORDER — CELECOXIB 100 MG PO CAPS
200.0000 mg | ORAL_CAPSULE | Freq: Two times a day (BID) | ORAL | Status: DC
  Administered 2018-07-15 – 2018-07-16 (×2): 200 mg via ORAL
  Filled 2018-07-15 (×2): qty 2

## 2018-07-15 MED ORDER — ACETAMINOPHEN 500 MG PO TABS
500.0000 mg | ORAL_TABLET | Freq: Four times a day (QID) | ORAL | Status: AC
  Administered 2018-07-15 – 2018-07-16 (×4): 500 mg via ORAL
  Filled 2018-07-15 (×4): qty 1

## 2018-07-15 MED ORDER — FENTANYL CITRATE (PF) 250 MCG/5ML IJ SOLN
INTRAMUSCULAR | Status: AC
  Filled 2018-07-15: qty 5

## 2018-07-15 MED ORDER — METHOCARBAMOL 1000 MG/10ML IJ SOLN
500.0000 mg | Freq: Four times a day (QID) | INTRAVENOUS | Status: DC | PRN
  Filled 2018-07-15: qty 5

## 2018-07-15 MED ORDER — EPHEDRINE SULFATE 50 MG/ML IJ SOLN
INTRAMUSCULAR | Status: DC | PRN
  Administered 2018-07-15: 10 mg via INTRAVENOUS

## 2018-07-15 MED ORDER — GABAPENTIN 300 MG PO CAPS
300.0000 mg | ORAL_CAPSULE | Freq: Three times a day (TID) | ORAL | Status: DC
  Administered 2018-07-15 – 2018-07-16 (×3): 300 mg via ORAL
  Filled 2018-07-15 (×3): qty 1

## 2018-07-15 MED ORDER — TRANEXAMIC ACID-NACL 1000-0.7 MG/100ML-% IV SOLN
1000.0000 mg | Freq: Once | INTRAVENOUS | Status: AC
  Administered 2018-07-15: 1000 mg via INTRAVENOUS
  Filled 2018-07-15: qty 100

## 2018-07-15 MED ORDER — PROPOFOL 10 MG/ML IV BOLUS
INTRAVENOUS | Status: DC | PRN
  Administered 2018-07-15: 150 mg via INTRAVENOUS

## 2018-07-15 MED ORDER — CHLORHEXIDINE GLUCONATE 4 % EX LIQD: 60.0000 mL | Freq: Once | CUTANEOUS | Status: DC

## 2018-07-15 SURGICAL SUPPLY — 75 items
ACETAB CUP W GRIPTION 54MM (Plate) ×1 IMPLANT
ACETAB CUP W/GRIPTION 54 (Plate) ×2 IMPLANT
BIT DRILL 2.8X128 (BIT) ×2 IMPLANT
BIT DRILL 2.8X128MM (BIT) ×1
BLADE HEX COATED 2.75 (ELECTRODE) ×3 IMPLANT
BLADE SAGITTAL 25.0X1.27X90 (BLADE) ×2 IMPLANT
BLADE SAGITTAL 25.0X1.27X90MM (BLADE) ×1
CLOTH BEACON ORANGE TIMEOUT ST (SAFETY) ×3 IMPLANT
COVER LIGHT HANDLE STERIS (MISCELLANEOUS) ×6 IMPLANT
COVER WAND RF STERILE (DRAPES) ×2 IMPLANT
CUP ACETAB W/GRIPTION 54 (Plate) IMPLANT
DECANTER SPIKE VIAL GLASS SM (MISCELLANEOUS) ×4 IMPLANT
DRAPE BACK TABLE (DRAPES) ×3 IMPLANT
DRAPE HIP W/POCKET STRL (DRAPE) ×3 IMPLANT
DRAPE INCISE IOBAN 44X35 STRL (DRAPES) ×3 IMPLANT
DRAPE U-SHAPE 47X51 STRL (DRAPES) ×3 IMPLANT
DRESSING ALLEVYN LIFE SACRUM (GAUZE/BANDAGES/DRESSINGS) ×2 IMPLANT
DRSG MEPILEX BORDER 4X12 (GAUZE/BANDAGES/DRESSINGS) ×3 IMPLANT
DURAPREP 26ML APPLICATOR (WOUND CARE) ×6 IMPLANT
ELECT REM PT RETURN 9FT ADLT (ELECTROSURGICAL) ×3
ELECTRODE REM PT RTRN 9FT ADLT (ELECTROSURGICAL) ×1 IMPLANT
ELIMINATOR HOLE APEX DEPUY (Hips) ×2 IMPLANT
GLOVE BIO SURGEON STRL SZ7 (GLOVE) ×4 IMPLANT
GLOVE BIOGEL PI IND STRL 7.0 (GLOVE) ×1 IMPLANT
GLOVE BIOGEL PI INDICATOR 7.0 (GLOVE) ×10
GLOVE SKINSENSE NS SZ8.0 LF (GLOVE) ×4
GLOVE SKINSENSE STRL SZ8.0 LF (GLOVE) ×2 IMPLANT
GLOVE SS N UNI LF 8.5 STRL (GLOVE) ×3 IMPLANT
GOWN STRL REUS W/ TWL LRG LVL3 (GOWN DISPOSABLE) ×1 IMPLANT
GOWN STRL REUS W/TWL LRG LVL3 (GOWN DISPOSABLE) ×6 IMPLANT
GOWN STRL REUS W/TWL XL LVL3 (GOWN DISPOSABLE) ×3 IMPLANT
HANDPIECE INTERPULSE COAX TIP (DISPOSABLE) ×3
HEAD M SROM 36MM PLUS 1.5 (Hips) IMPLANT
HOOD W/PEELAWAY (MISCELLANEOUS) ×10 IMPLANT
INST SET MAJOR BONE (KITS) ×3 IMPLANT
IV NS IRRIG 3000ML ARTHROMATIC (IV SOLUTION) ×3 IMPLANT
KIT BLADEGUARD II DBL (SET/KITS/TRAYS/PACK) ×3 IMPLANT
KIT TURNOVER KIT A (KITS) ×3 IMPLANT
LINER NEUTRAL 54X36MM PLUS 4 (Hips) ×2 IMPLANT
MANIFOLD NEPTUNE II (INSTRUMENTS) ×3 IMPLANT
MARKER SKIN DUAL TIP RULER LAB (MISCELLANEOUS) ×3 IMPLANT
NDL HYPO 18GX1.5 BLUNT FILL (NEEDLE) ×2 IMPLANT
NDL HYPO 21X1.5 SAFETY (NEEDLE) ×1 IMPLANT
NDL HYPO 25X1 1.5 SAFETY (NEEDLE) ×1 IMPLANT
NDL MAYO 6 CRC TAPER PT (NEEDLE) IMPLANT
NEEDLE HYPO 18GX1.5 BLUNT FILL (NEEDLE) ×6 IMPLANT
NEEDLE HYPO 21X1.5 SAFETY (NEEDLE) ×3 IMPLANT
NEEDLE HYPO 25X1 1.5 SAFETY (NEEDLE) ×3 IMPLANT
NEEDLE MAYO 6 CRC TAPER PT (NEEDLE) ×3 IMPLANT
NS IRRIG 1000ML POUR BTL (IV SOLUTION) ×3 IMPLANT
PACK TOTAL JOINT (CUSTOM PROCEDURE TRAY) ×3 IMPLANT
PAD ARMBOARD 7.5X6 YLW CONV (MISCELLANEOUS) ×3 IMPLANT
PIN STMN SNGL STERILE 9X3.6MM (PIN) ×6 IMPLANT
SCREW 6.5MMX25MM (Screw) ×2 IMPLANT
SET BASIN LINEN APH (SET/KITS/TRAYS/PACK) ×3 IMPLANT
SET HNDPC FAN SPRY TIP SCT (DISPOSABLE) ×1 IMPLANT
SPONGE LAP 18X18 RF (DISPOSABLE) ×3 IMPLANT
SROM M HEAD 36MM PLUS 1.5 (Hips) ×3 IMPLANT
STAPLER VISISTAT 35W (STAPLE) ×3 IMPLANT
STEM TRI LOC BPS GRIPTON SZ 5 (Hips) IMPLANT
SUT BRALON NAB BRD #1 30IN (SUTURE) ×6 IMPLANT
SUT ETHIBOND 5 LR DA (SUTURE) ×5 IMPLANT
SUT MNCRL 0 VIOLET CTX 36 (SUTURE) ×1 IMPLANT
SUT MON AB 2-0 CT1 36 (SUTURE) ×3 IMPLANT
SUT MONOCRYL 0 CTX 36 (SUTURE) ×2
SUT VIC AB 1 CT1 27 (SUTURE) ×15
SUT VIC AB 1 CT1 27XBRD ANTBC (SUTURE) ×2 IMPLANT
SYR 20CC LL (SYRINGE) ×9 IMPLANT
SYR 30ML LL (SYRINGE) ×3 IMPLANT
SYR BULB IRRIGATION 50ML (SYRINGE) ×3 IMPLANT
TOWEL OR 17X26 4PK STRL BLUE (TOWEL DISPOSABLE) ×3 IMPLANT
TRAY FOLEY METER SIL LF 16FR (CATHETERS) ×3 IMPLANT
TRI LOC BPS W GRIPTON SZ 5 (Hips) ×3 IMPLANT
WATER STERILE IRR 1000ML POUR (IV SOLUTION) ×4 IMPLANT
YANKAUER SUCT 12FT TUBE ARGYLE (SUCTIONS) ×3 IMPLANT

## 2018-07-15 NOTE — Evaluation (Signed)
Physical Therapy Evaluation Patient Details Name: Cameron Flynn MRN: [id number] DOB: 12-09-1965 Today's Date: 07/15/2018   History of Present Illness  Cameron Flynn is a 53 y/o s/p Left THA, 07/15/18 with the diagnosis of left hip osteoarthritis.  Clinical Impression  Patient agreeable for therapy and received pain medication prior to OOB, demonstrates labored movement for moving LLE and sitting up at bedside, demonstrates fair/good tolerance/edurance for ambulation in hallway with mostly heel to toe stepping on LLE without losing balance.  Patient tolerated sitting up in chair after therapy - RN notified.  Patient will benefit from continued physical therapy in hospital and recommended venue below to increase strength, balance, endurance for safe ADLs and gait.    Follow Up Recommendations Home health PT;Supervision for mobility/OOB;Supervision - Intermittent    Equipment Recommendations  None recommended by PT    Recommendations for Other Services       Precautions / Restrictions Precautions Precautions: Fall Restrictions Weight Bearing Restrictions: Yes LLE Weight Bearing: Weight bearing as tolerated      Mobility  Bed Mobility Overal bed mobility: Needs Assistance Bed Mobility: Supine to Sit     Supine to sit: Min guard     General bed mobility comments: slow labored movement, difficulty moving LLE  Transfers Overall transfer level: Needs assistance Equipment used: Rolling walker (2 wheeled) Transfers: Sit to/from [facility] Sit to Stand: Min guard Stand pivot transfers: Min guard       General transfer comment: verbal cues for proper hand placement during sit to stands with good carryover  Ambulation/Gait Ambulation/Gait assistance: Supervision;Min guard Gait Distance (Feet): 75 Feet Assistive device: Rolling walker (2 wheeled) Gait Pattern/deviations: Decreased step length - left;Decreased stance time - left;Decreased stride length Gait  velocity: decreased   General Gait Details: slightly labored slow cadence with fair/good return for left heel to toe stepping without loss of balance, occasional standing rest breaks  Stairs            Wheelchair Mobility    Modified Rankin (Stroke Patients Only)       Balance Overall balance assessment: Needs assistance Sitting-balance support: Feet supported;No upper extremity supported Sitting balance-Leahy Scale: Fair Sitting balance - Comments: fair/good seated at bedside   Standing balance support: During functional activity;Bilateral upper extremity supported Standing balance-Leahy Scale: Fair Standing balance comment: using RW                             Pertinent Vitals/Pain Pain Assessment: Faces Faces Pain Scale: Hurts even more Pain Location: left hip Pain Descriptors / Indicators: Sore Pain Intervention(s): Limited activity within patient's tolerance;Monitored during session;Premedicated before session    Home Living Family/patient expects to be discharged to:: Private residence Living Arrangements: Spouse/significant other Available Help at Discharge: Family;Available PRN/intermittently Type of Home: House Home Access: Level entry     Home Layout: Two level;Able to live on main level with bedroom/bathroom;Other (Comment)(Patient sleeps on a couch on main level) Home Equipment: Cane - single point;Walker - 2 wheels;Shower seat;Bedside commode      Prior Function Level of Independence: Independent         Comments: Hydrographic surveyor, drives     Journalist, newspaper        Extremity/Trunk Assessment   Upper Extremity Assessment Upper Extremity Assessment: Overall WFL for tasks assessed    Lower Extremity Assessment Lower Extremity Assessment: Overall WFL for tasks assessed;LLE deficits/detail LLE Deficits / Details: grossly -4/5 LLE: Unable  to fully assess due to pain LLE Sensation: WNL LLE Coordination: WNL    Cervical /  Trunk Assessment Cervical / Trunk Assessment: Normal  Communication   Communication: HOH  Cognition Arousal/Alertness: Awake/alert Behavior During Therapy: WFL for tasks assessed/performed Overall Cognitive Status: Within Functional Limits for tasks assessed                                        General Comments      Exercises     Assessment/Plan    PT Assessment Patient needs continued PT services  PT Problem List Decreased strength;Decreased activity tolerance;Decreased balance;Decreased mobility       PT Treatment Interventions Gait training;Stair training;Functional mobility training;Therapeutic activities;Patient/family education;Therapeutic exercise    PT Goals (Current goals can be found in the Care Plan section)  Acute Rehab PT Goals Patient Stated Goal: return home with girlfriend to assist PT Goal Formulation: With patient Time For Goal Achievement: 07/18/18 Potential to Achieve Goals: Good    Frequency 7X/week   Barriers to discharge        Co-evaluation               AM-PAC PT "6 Clicks" Mobility  Outcome Measure Help needed turning from your back to your side while in a flat bed without using bedrails?: None Help needed moving from lying on your back to sitting on the side of a flat bed without using bedrails?: A Little Help needed moving to and from a bed to a chair (including a wheelchair)?: A Little Help needed standing up from a chair using your arms (e.g., wheelchair or bedside chair)?: A Little Help needed to walk in hospital room?: A Little Help needed climbing 3-5 steps with a railing? : A Little 6 Click Score: 19    End of Session   Activity Tolerance: Patient tolerated treatment well;Patient limited by fatigue Patient left: in chair;with call bell/phone within reach;with chair alarm set Nurse Communication: Mobility status PT Visit Diagnosis: Other abnormalities of gait and mobility (R26.89);Unsteadiness on feet  (R26.81);Muscle weakness (generalized) (M62.81)    Time: 1550-1620 PT Time Calculation (min) (ACUTE ONLY): 30 min   Charges:   PT Evaluation $PT Eval Moderate Complexity: 1 Mod PT Treatments $Therapeutic Activity: 23-37 mins        4:50 PM, 07/15/18 Ocie BobJames Dupree Givler, MPT Physical Therapist [facility] [phone] office [phone] mobile phone

## 2018-07-15 NOTE — Brief Op Note (Signed)
07/15/2018  9:50 AM  PATIENT:  Cameron Flynn  53 y.o. male  PRE-OPERATIVE DIAGNOSIS:  left hip osteoarthritis  POST-OPERATIVE DIAGNOSIS:  left hip osteoarthritis  PROCEDURE:  Procedure(s): TOTAL HIP ARTHROPLASTY LEFT (Left) - 27130  SURGEON:  Surgeon(s) and Role:    Carole Civil, MD - Primary  IMPLANTS DEPUY TRILOCK STEM 5 HO 36 X 1.5 HEAD / NECK 54 CUP 36 X 54 POLYETHYLENE   PHYSICIAN ASSISTANT:   ASSISTANTS: BETTY ASHLEY    ANESTHESIA:  GENERAL  EBL:  150 mL   BLOOD ADMINISTERED:none  DRAINS: none   LOCAL MEDICATIONS USED:  MARCAINE W/ EPI   , Amount: 30 ml and OTHER EXPAREL 20   SPECIMEN:  No Specimen  DISPOSITION OF SPECIMEN:  N/A  COUNTS:  YES  TOURNIQUET:  * No tourniquets in log *  DICTATION: .Dragon Dictation  PLAN OF CARE: Admit to inpatient   PATIENT DISPOSITION:  PACU - hemodynamically stable.   Delay start of Pharmacological VTE agent (>24hrs) due to surgical blood loss or risk of bleeding: not applicable

## 2018-07-15 NOTE — Progress Notes (Signed)
Pain better controlled now than earlier in afternoon.  Has received scheduled pain medication as well as prn.  Sat up in chair for several hours after PT and moved back to bed with use of walker and standby.  Teds, scds, ice applied and heel elevated on pillow.

## 2018-07-15 NOTE — Transfer of Care (Signed)
Immediate Anesthesia Transfer of Care Note  Patient: Cameron Flynn  Procedure(s) Performed: TOTAL HIP ARTHROPLASTY LEFT (Left Hip)  Patient Location: PACU  Anesthesia Type:General  Level of Consciousness: awake, alert  and oriented  Airway & Oxygen Therapy: Patient Spontanous Breathing and Patient connected to face mask oxygen  Post-op Assessment: Report given to RN and Patient moving all extremities X 4  Post vital signs: Reviewed and stable  Last Vitals:  Vitals Value Taken Time  BP 164/96 07/15/18 1001  Temp    Pulse 88 07/15/18 1004  Resp 20 07/15/18 1004  SpO2 98 % 07/15/18 1004  Vitals shown include unvalidated device data.  Last Pain:  Vitals:   07/15/18 0647  TempSrc: Oral  PainSc: 5       Patients Stated Pain Goal: 6 (22/44/97 5300)  Complications: No apparent anesthesia complications

## 2018-07-15 NOTE — Op Note (Addendum)
07/15/2018  9:50 AM  PATIENT:  Cameron Flynn  53 y.o. male  PRE-OPERATIVE DIAGNOSIS:  left hip osteoarthritis  POST-OPERATIVE DIAGNOSIS:  left hip osteoarthritis  PROCEDURE:  Procedure(s): TOTAL HIP ARTHROPLASTY LEFT (Left) - 27130  SURGEON:  Surgeon(s) and Role:    Carole Civil, MD - Primary  IMPLANTS DEPUY TRILOCK STEM 5 HO 36 X 1.5 HEAD / NECK 54 CUP 36 X 54 POLYETHYLENE   Surgical dictation for  LEFT  hip replacement  The patient was identified in the preoperative holding area. The site was marked. The chart was reviewed and updated. The patient was taken to the operating room for GENERAL anesthesia. A Foley catheter was inserted. The patient was placed in lateral decubitus position with the operative site up. Stulberg positioner was used to hold the patient in place and axillary roll was provided as needed.  Standard preoperative antibiotics were given per protocol using SCIP recommendations.  After the sterile prep and drape the timeout was completed. All implants were accounted for x-rays were visible and everyone agreed on the surgical site LEFT HIP     The incision was made over the greater trochanter and deepened through subcutaneous tissue. The fascia was exposed and incised in line with the skin incision. The greater trochanteric bursa was excised. Blunt dissection was carried out in line of the fibers of the gluteus medius. The gluteus medius was subperiosteally dissected from the greater trochanter, the gluteus minimus was split in a T-shaped fashion in continuity with the hip capsule and retracted anteriorly and posteriorly.. These structures were tagged and retracted proximally. 2 Steinmann pins were placed in the pelvis as retractors.  The hip was dislocated anteriorly. A cutting guide was used to perform the proximal neck cut. The head was removed.  The acetabulum was evaluated and labrum and osteophytes were resected anteriorly and posterior retractors were  placed.  Reaming was initiated with a 44 reamer and progressively this increased up to a size 54.  The goal is to place the cup at 45 degrees abduction and 15 degrees anteversion using the anterior posterior columns and transverse acetabular ligament as landmarks  We placed a small amount of bone graft from the acetabular reamers and then inserted the 54 cup with a hole eliminator and 1 screw which was placed in standard fashion using a drill bit.  The screw was 25 mm in length  The acetabular shell was placed and checked for security.    We next turned our attention to the femur. A starter hole reamer was passed followed by box osteotome followed by canal finder.  We broached the canal up to a size 5.  2 drill holes were placed in the greater trochanter and a #5 Ethibond suture was passed.  #5 high offset stem was inserted.  We then performed trial reduction with a size 5 femoral stem 54 cup with a 36 x 54 liner 36 x 1.5 head.  Leg length was confirmed first followed by shuck test followed by flexion internal rotation test followed by external rotation extension test followed by sleep test.  Trial head and neck were removed 36 head was placed and the trial reduction was repeated.  There is no change in the range of motion or stability of the hip  The wound was irrigated thoroughly.  The gluteus medius was repaired with #1 Vicryl and repaired back to the greater trochanter and the gluteus medius tendon  The gluteus medius was repaired to the greater trochanter  with a #5 Ethibond suture which was passed through the drill holes followed by interrupted #1 Braylon sutures.  The fascial layer was closed with #1 Braylon followed by 0 Monocryl and skin staples  Exparel was injected in the first layer of closure in the musculature around the hip and then 30 cc of Marcaine with epinephrine was injected in the soft tissues including the fascial layer.  PHYSICIAN ASSISTANT:   ASSISTANTS: BETTY  ASHLEY    ANESTHESIA:  GENERAL  EBL:  150 mL   BLOOD ADMINISTERED:none  DRAINS: none   LOCAL MEDICATIONS USED:  MARCAINE W/ EPI   , Amount: 30 ml and OTHER EXPAREL 20   SPECIMEN:  No Specimen  DISPOSITION OF SPECIMEN:  N/A  COUNTS:  YES  TOURNIQUET:  * No tourniquets in log *  DICTATION: .Dragon Dictation  PLAN OF CARE: Admit to inpatient   PATIENT DISPOSITION:  PACU - hemodynamically stable.   Delay start of Pharmacological VTE agent (>24hrs) due to surgical blood loss or risk of bleeding: not applicable

## 2018-07-15 NOTE — Anesthesia Postprocedure Evaluation (Signed)
Anesthesia Post Note Late entry for 1100  Patient: Cameron Flynn  Procedure(s) Performed: TOTAL HIP ARTHROPLASTY LEFT (Left Hip)  Patient location during evaluation: PACU Anesthesia Type: General Level of consciousness: awake and alert and oriented Pain management: pain level controlled Vital Signs Assessment: post-procedure vital signs reviewed and stable Respiratory status: spontaneous breathing Cardiovascular status: stable Postop Assessment: no headache Anesthetic complications: no     Last Vitals:  Vitals:   07/15/18 1335 07/15/18 1400  BP: (!) 162/86 (!) 159/85  Pulse: 74 71  Resp: 16 16  Temp: 36.4 C 36.4 C  SpO2:  94%    Last Pain:  Vitals:   07/15/18 1400  TempSrc: Oral  PainSc:                  Davy Faught A

## 2018-07-15 NOTE — Plan of Care (Signed)
  Problem: Acute Rehab PT Goals(only PT should resolve) Goal: Pt Will Go Supine/Side To Sit Outcome: Progressing Flowsheets (Taken 07/15/2018 1651) Pt will go Supine/Side to Sit: with supervision Goal: Patient Will Transfer Sit To/From Stand Outcome: Progressing Flowsheets (Taken 07/15/2018 1651) Patient will transfer sit to/from stand: with modified independence Goal: Pt Will Transfer Bed To Chair/Chair To Bed Outcome: Progressing Flowsheets (Taken 07/15/2018 1651) Pt will Transfer Bed to Chair/Chair to Bed: with modified independence Goal: Pt Will Ambulate Outcome: Progressing Flowsheets (Taken 07/15/2018 1651) Pt will Ambulate:  > 125 feet  with modified independence  with rolling walker   4:52 PM, 07/15/18 Lonell Grandchild, MPT Physical Therapist with [facility] [phone] office [phone] mobile phone

## 2018-07-15 NOTE — Anesthesia Preprocedure Evaluation (Addendum)
Anesthesia Evaluation  Patient identified by MRN, date of birth, ID band Patient awake    Reviewed: Allergy & Precautions, H&P , NPO status , Patient's Chart, lab work & pertinent test results, reviewed documented beta blocker date and time   Airway Mallampati: II  TM Distance: >3 FB Neck ROM: full  Mouth opening: Limited Mouth Opening  Dental  (+) Upper Dentures, Lower Dentures, Edentulous Upper, Edentulous Lower   Pulmonary Current Smoker,    Pulmonary exam normal        Cardiovascular hypertension, Normal cardiovascular exam     Neuro/Psych negative neurological ROS  negative psych ROS   GI/Hepatic negative GI ROS, Neg liver ROS,   Endo/Other  negative endocrine ROS  Renal/GU negative Renal ROS  negative genitourinary   Musculoskeletal   Abdominal   Peds  Hematology negative hematology ROS (+)   Anesthesia Other Findings   Reproductive/Obstetrics                            Anesthesia Physical Anesthesia Plan  ASA: III  Anesthesia Plan: General   Post-op Pain Management:    Induction:   PONV Risk Score and Plan:   Airway Management Planned:   Additional Equipment:   Intra-op Plan:   Post-operative Plan:   Informed Consent: I have reviewed the patients History and Physical, chart, labs and discussed the procedure including the risks, benefits and alternatives for the proposed anesthesia with the patient or authorized representative who has indicated his/her understanding and acceptance.       Plan Discussed with: CRNA and Surgeon  Anesthesia Plan Comments:        Anesthesia Quick Evaluation

## 2018-07-15 NOTE — Anesthesia Procedure Notes (Signed)
Procedure Name: Intubation Date/Time: 07/15/2018 7:37 AM Performed by: Louann Sjogren, MD Pre-anesthesia Checklist: Patient identified, Patient being monitored, Timeout performed, Emergency Drugs available and Suction available Patient Re-evaluated:Patient Re-evaluated prior to induction Oxygen Delivery Method: Circle System Utilized Preoxygenation: Pre-oxygenation with 100% oxygen Induction Type: IV induction Ventilation: Mask ventilation without difficulty Laryngoscope Size: Miller and 2 (Disposable blade) Grade View: Grade II Tube type: Oral Tube size: 7.0 mm Number of attempts: 1 Airway Equipment and Method: stylet Placement Confirmation: ETT inserted through vocal cords under direct vision,  positive ETCO2 and breath sounds checked- equal and bilateral Secured at: 21 cm Tube secured with: Tape Dental Injury: Teeth and Oropharynx as per pre-operative assessment

## 2018-07-15 NOTE — Interval H&P Note (Signed)
History and Physical Interval Note:  07/15/2018 7:20 AM  Cameron Flynn  has presented today for surgery, with the diagnosis of left hip osteoarthritis.  The various methods of treatment have been discussed with the patient and family. After consideration of risks, benefits and other options for treatment, the patient has consented to  Procedure(s): TOTAL HIP ARTHROPLASTY left (Left) as a surgical intervention.  The patient's history has been reviewed, patient examined, no change in status, stable for surgery.  I have reviewed the patient's chart and labs.  Questions were answered to the patient's satisfaction.     Arther Abbott

## 2018-07-16 ENCOUNTER — Telehealth: Payer: Self-pay | Admitting: Orthopedic Surgery

## 2018-07-16 ENCOUNTER — Encounter (HOSPITAL_COMMUNITY): Payer: Self-pay | Admitting: Orthopedic Surgery

## 2018-07-16 LAB — BASIC METABOLIC PANEL
Anion gap: 11 (ref 5–15)
BUN: 13 mg/dL (ref 6–20)
CO2: 24 mmol/L (ref 22–32)
Calcium: 9 mg/dL (ref 8.9–10.3)
Chloride: 106 mmol/L (ref 98–111)
Creatinine, Ser: 0.88 mg/dL (ref 0.61–1.24)
GFR calc Af Amer: >60 mL/min (ref >60)
GFR calc non Af Amer: >60 mL/min (ref >60)
Glucose, Bld: 143 mg/dL — ABNORMAL HIGH (ref 70–99)
Potassium: 5.6 mmol/L — ABNORMAL HIGH (ref 3.5–5.1)
Sodium: 141 mmol/L (ref 135–145)

## 2018-07-16 LAB — CBC
HCT: 42.6 % (ref 39.0–52.0)
Hemoglobin: 13.6 g/dL (ref 13.0–17.0)
MCH: 33 pg (ref 26.0–34.0)
MCHC: 31.9 g/dL (ref 30.0–36.0)
MCV: 103.4 fL — ABNORMAL HIGH (ref 80.0–100.0)
Platelets: 243 10*3/uL (ref 150–400)
RBC: 4.12 MIL/uL — ABNORMAL LOW (ref 4.22–5.81)
RDW: 13.7 % (ref 11.5–15.5)
WBC: 17.1 10*3/uL — ABNORMAL HIGH (ref 4.0–10.5)
nRBC: 0 % (ref 0.0–0.2)

## 2018-07-16 MED ORDER — SENNOSIDES-DOCUSATE SODIUM 8.6-50 MG PO TABS: 1.0000 | ORAL_TABLET | Freq: Every evening | ORAL | 0 refills | Status: AC | PRN

## 2018-07-16 MED ORDER — TRAMADOL HCL 50 MG PO TABS: 50.0000 mg | ORAL_TABLET | Freq: Four times a day (QID) | ORAL | 1 refills | Status: AC | PRN

## 2018-07-16 MED ORDER — HYDROCODONE-ACETAMINOPHEN 7.5-325 MG PO TABS: 1.0000 | ORAL_TABLET | ORAL | 0 refills | Status: DC | PRN

## 2018-07-16 MED ORDER — METHOCARBAMOL 500 MG PO TABS: 500.0000 mg | ORAL_TABLET | Freq: Four times a day (QID) | ORAL | 1 refills | Status: DC | PRN

## 2018-07-16 MED ORDER — ASPIRIN 325 MG PO TBEC: 325.0000 mg | DELAYED_RELEASE_TABLET | Freq: Every day | ORAL | 0 refills | Status: DC

## 2018-07-16 NOTE — Progress Notes (Signed)
Physical Therapy Treatment Patient Details Name: Cameron CompanionShane C Pompa MRN: [id number] DOB: 07/20/1965 Today's Date: 07/16/2018    History of Present Illness Cameron Flynn is a 53 y/o s/p Left THA, 07/15/18 with the diagnosis of left hip osteoarthritis.    PT Comments    Patient agreeable for therapy and demonstrates increased endurance/distance for gait training with good return for left heel to toe stepping on level, inclined and declined surfaces without loss of balance.  Patient demonstrates good return for going up/down steps using bilateral side rails without loss of balance, instructed in HEP with good carryover demonstrated and understanding acknowledged.  Patient tolerated sitting up in chair after therapy.  Patient will benefit from continued physical therapy in hospital and recommended venue below to increase strength, balance, endurance for safe ADLs and gait.    Follow Up Recommendations  Home health PT;Supervision for mobility/OOB;Supervision - Intermittent     Equipment Recommendations  None recommended by PT    Recommendations for Other Services       Precautions / Restrictions Precautions Precautions: Fall Restrictions Weight Bearing Restrictions: Yes LLE Weight Bearing: Weight bearing as tolerated    Mobility  Bed Mobility Overal bed mobility: Modified Independent Bed Mobility: Supine to Sit     Supine to sit: Min guard     General bed mobility comments: increased time, slightly labored movement, good return for moving LLE  Transfers Overall transfer level: Modified independent Equipment used: Rolling walker (2 wheeled) Transfers: Sit to/from [employer] CorporationStand;Stand Pivot Transfers Sit to Stand: Modified independent (Device/Increase time) Stand pivot transfers: Modified independent (Device/Increase time)       General transfer comment: good return for using RW for sit to stands, transfers and going to bathroom  Ambulation/Gait Ambulation/Gait assistance:  Modified independent (Device/Increase time) Gait Distance (Feet): 150 Feet Assistive device: Rolling walker (2 wheeled) Gait Pattern/deviations: Decreased step length - left;Decreased stance time - left;Decreased stride length;Antalgic Gait velocity: decreased   General Gait Details: demonstrates increased endurance/distance for gait training on level, inclined and declined surfaces with good return for left heel to toe stepping without loss of balance   Stairs Stairs: Yes Stairs assistance: Modified independent (Device/Increase time) Stair Management: Two rails;Step to pattern Number of Stairs: 6 General stair comments: demonstrates good return for going up/down stairs in stairwell with proper sequence of involved leg (LLE) using bilateral siderails without loss of balance   Wheelchair Mobility    Modified Rankin (Stroke Patients Only)       Balance Overall balance assessment: Needs assistance Sitting-balance support: Feet supported;No upper extremity supported Sitting balance-Leahy Scale: Good     Standing balance support: During functional activity;No upper extremity supported Standing balance-Leahy Scale: Fair Standing balance comment: good using RW                            Cognition Arousal/Alertness: Awake/alert Behavior During Therapy: WFL for tasks assessed/performed Overall Cognitive Status: Within Functional Limits for tasks assessed                                        Exercises Total Joint Exercises Ankle Circles/Pumps: AROM;Both;Strengthening;10 reps;Supine Quad Sets: AROM;Strengthening;Both;10 reps;Supine Gluteal Sets: AROM;Strengthening;Both;10 reps;Supine Short Arc Quad: AROM;Strengthening;10 reps;Supine;Left Heel Slides: AROM;Strengthening;Left;10 reps;Supine    General Comments        Pertinent Vitals/Pain Pain Assessment: Faces Faces Pain Scale: Hurts a little bit  Pain Location: left hip Pain Descriptors /  Indicators: Sore Pain Intervention(s): Limited activity within patient's tolerance;Monitored during session    Home Living                      Prior Function            PT Goals (current goals can now be found in the care plan section) Acute Rehab PT Goals Patient Stated Goal: return home with girlfriend to assist PT Goal Formulation: With patient Time For Goal Achievement: 07/18/18 Potential to Achieve Goals: Good Progress towards PT goals: Progressing toward goals    Frequency    7X/week      PT Plan Current plan remains appropriate    Co-evaluation              AM-PAC PT "6 Clicks" Mobility   Outcome Measure  Help needed turning from your back to your side while in a flat bed without using bedrails?: None Help needed moving from lying on your back to sitting on the side of a flat bed without using bedrails?: None Help needed moving to and from a bed to a chair (including a wheelchair)?: None Help needed standing up from a chair using your arms (e.g., wheelchair or bedside chair)?: None Help needed to walk in hospital room?: A Little Help needed climbing 3-5 steps with a railing? : A Little 6 Click Score: 22    End of Session   Activity Tolerance: Patient tolerated treatment well;Patient limited by fatigue Patient left: in chair;with call bell/phone within reach Nurse Communication: Mobility status PT Visit Diagnosis: Other abnormalities of gait and mobility (R26.89);Unsteadiness on feet (R26.81);Muscle weakness (generalized) (M62.81)     Time: 9390-3009 PT Time Calculation (min) (ACUTE ONLY): 33 min  Charges:  $Gait Training: 8-22 mins $Therapeutic Exercise: 8-22 mins                     10:12 AM, 07/16/18 Lonell Grandchild, MPT Physical Therapist with [facility] 336 [phone] office [phone] mobile phone

## 2018-07-16 NOTE — Addendum Note (Signed)
Addendum  created 07/16/18 0730 by Ollen Bowl, CRNA   Clinical Note Signed

## 2018-07-16 NOTE — TOC Transition Note (Signed)
Transition of Care [facility]) - CM/SW Discharge Note   Patient Details  Name: Cameron Flynn MRN: [id number] Date of Birth: [birth year]  Transition of Care [facility]) CM/SW Contact:  Trish Mage, LCSW Phone Number: 07/16/2018, 2:57 PM   Clinical Narrative:   CSW alerted by nursing this AM that patient is being discharged.  Told nursing to go ahead, and CSW would make appropriate referral.  Called Tim at [facility] as they are up for indigent care.  Left v/m message with patient's demographics.    Final next level of care: [place] Barriers to Discharge: No Barriers Identified   Patient Goals and CMS Choice        Discharge Placement                       Discharge Plan and Services                          HH Arranged: PT [place] Agency: [facility] ([facility]) Date [place]: 07/16/18 Time [place]: [place] Representative spoke with at [place]: [place] ([place]) Interventions     Readmission Risk Interventions No flowsheet data found.

## 2018-07-16 NOTE — Addendum Note (Signed)
Addendum  created 07/16/18 1056 by Viral Schramm E, CRNA   Clinical Note Signed    

## 2018-07-16 NOTE — Anesthesia Postprocedure Evaluation (Addendum)
Anesthesia Post Note  Patient: Cameron Flynn  Procedure(s) Performed: TOTAL HIP ARTHROPLASTY LEFT (Left Hip)  Patient location during evaluation: Nursing Unit Anesthesia Type: General Level of consciousness: awake and alert and oriented Pain management: pain level controlled Vital Signs Assessment: post-procedure vital signs reviewed and stable Respiratory status: spontaneous breathing Cardiovascular status: blood pressure returned to baseline and stable Postop Assessment: no apparent nausea or vomiting and able to ambulate Anesthetic complications: no     Last Vitals:  Vitals:   07/16/18 0330 07/16/18 0732  BP: 119/66 (!) 144/77  Pulse: 63 70  Resp: 16 16  Temp: (!) 36.4 C 36.7 C  SpO2: 94% 97%    Last Pain:  Vitals:   07/16/18 0900  TempSrc:   PainSc: 4                  Elzie Knisley

## 2018-07-16 NOTE — Addendum Note (Signed)
Addendum  created 07/16/18 0730 by Ollen Bowl, CRNA   Clinical Note Signed, SmartForm saved

## 2018-07-16 NOTE — Discharge Summary (Signed)
Physician Discharge Summary  Patient ID: Cameron Flynn MRN: [id number] DOB/AGE: [birth year] 53 y.o.  Admit date: 07/15/2018 Discharge date: 07/16/2018  Admission Diagnoses: Avascular necrosis left hip  Discharge Diagnoses: Avascular necrosis left hip  Discharged Condition: stable  Procedure: Left total hip arthroplasty [place] Tri-Lock stem 5 high offset, 36 x 1.5 femoral metal head, 54 cup with 1 screw.  36/54 polyethylene insert  Hospital Course: Unremarkable hospital course.  Patient had general anesthesia for surgery on the date of admission July 14  Patient ambulated greater than 100 feet on the day of surgery.  Pain was well controlled.  He was afebrile vital signs were stable neurovascular exam was intact wound was dry on the date of discharge  Pain was well controlled.  CBC Latest Ref Rng & Units 07/16/2018 07/11/2018 02/12/2018  WBC 4.0 - 10.5 K/uL 17.1(H) 9.8 9.6  Hemoglobin 13.0 - 17.0 g/dL 40.913.6 81.115.8 12.3(L)  Hematocrit 39.0 - 52.0 % 42.6 46.8 37.6(L)  Platelets 150 - 400 K/uL 243 264 207   BMP Latest Ref Rng & Units 07/16/2018 07/11/2018 02/21/2018  Glucose 70 - 99 mg/dL 914(N143(H) 98 829(F109(H)  BUN 6 - 20 mg/dL 13 8 12   Creatinine 0.61 - 1.24 mg/dL 6.210.88 3.080.86 6.570.79  Sodium 135 - 145 mmol/L 141 136 131(L)  Potassium 3.5 - 5.1 mmol/L 5.6(H) 3.6 4.3  Chloride 98 - 111 mmol/L 106 102 100  CO2 22 - 32 mmol/L 24 23 23   Calcium 8.9 - 10.3 mg/dL 9.0 9.2 8.4(O8.7(L)      Discharge Exam: Blood pressure (!) 144/77, pulse 70, temperature 98.1 F (36.7 C), temperature source Oral, resp. rate 16, height 5\' 8"  (1.727 m), weight 79.8 kg, SpO2 97 %.   Disposition: Discharge disposition: 01-Home or Self Care       Discharge Instructions    Call MD / Call 911   Complete by: As directed    If you experience chest pain or shortness of breath, CALL 911 and be transported to the hospital emergency room.  If you develope a fever above 101 F, pus (white drainage) or increased drainage or  redness at the wound, or calf pain, call your surgeon's office.   Constipation Prevention   Complete by: As directed    Drink plenty of fluids.  Prune juice may be helpful.  You may use a stool softener, such as Colace (over the counter) 100 mg twice a day.  Use MiraLax (over the counter) for constipation as needed.   Diet - low sodium heart healthy   Complete by: As directed    Discharge instructions   Complete by: As directed    Weightbearing status as tolerated  Change dressing on Friday apply 4 x 4's and tape, do not shower for 2 weeks  Take aspirin daily for 30 days Wear the TED hose for 2 weeks   Face-to-face encounter (required for Medicare/Medicaid patients)   Complete by: As directed    I [place]  certify that this patient is under my care and that I, or a nurse practitioner or physician's assistant working with me, had a face-to-face encounter that meets the physician face-to-face encounter requirements with this patient on 07/16/2018. The encounter with the patient was in whole, or in part for the following medical condition(s) which is the primary reason for home health care (List medical condition): Avascular process left hip left total hip arthroplasty   The encounter with the patient was in whole, or in part, for the following medical condition, which  is the primary reason for home health care: Left total hip arthroplasty   I certify that, based on my findings, the following services are medically necessary home health services: Physical therapy   Reason for Medically [place]: Therapy- Personnel officer, Public librarian   My clinical findings support the need for the above services: Unable to leave home safely without assistance and/or assistive device   Further, I certify that my clinical findings support that this patient is homebound due to: Unsafe ambulation due to balance issues   Home Health   Complete by: As directed    To  provide the following care/treatments: PT   Increase activity slowly as tolerated   Complete by: As directed      Allergies as of 07/16/2018   No Known Allergies     Medication List    STOP taking these medications   amLODipine 5 MG tablet Commonly known as: NORVASC   docusate sodium 100 MG capsule Commonly known as: COLACE     TAKE these medications   aspirin 325 MG EC tablet Take 1 tablet (325 mg total) by mouth daily with breakfast.   atorvastatin 20 MG tablet Commonly known as: LIPITOR TAKE 1 Tablet BY MOUTH ONCE DAILY   HYDROcodone-acetaminophen 7.5-325 MG tablet Commonly known as: NORCO Take 1 tablet by mouth every 4 (four) hours as needed for moderate pain or severe pain.   ibuprofen 200 MG tablet Commonly known as: ADVIL Take 400 mg by mouth every 8 (eight) hours as needed (pain).   lisinopril 40 MG tablet Commonly known as: ZESTRIL TAKE 1 Tablet BY MOUTH ONCE DAILY   methocarbamol 500 MG tablet Commonly known as: ROBAXIN Take 1 tablet (500 mg total) by mouth every 6 (six) hours as needed for muscle spasms.   senna-docusate 8.6-50 MG tablet Commonly known as: Senokot-S Take 1 tablet by mouth at bedtime as needed for up to 10 days for mild constipation.   traMADol 50 MG tablet Commonly known as: ULTRAM Take 1 tablet (50 mg total) by mouth every 6 (six) hours as needed for up to 7 days for moderate pain.      Follow-up Information    Carole Civil, MD Follow up on 07/30/2018.   Specialties: Orthopedic Surgery, Radiology Why: Jodell Cipro take out Contact information: [address] [place] [address] [phone]           Signed: Arther Abbott 07/16/2018, 8:04 AM

## 2018-07-16 NOTE — Progress Notes (Signed)
Foley catheter removed without difficulty, will continue to monitor.

## 2018-07-16 NOTE — Progress Notes (Signed)
Has voided since foley removal this morning.  Walked well with therapy.  Hip dressing dry and intact.  IV removed and discharge instructions reviewed.  Scripts sent to pharmacy and followup appts made.  Girlfriend to drive home.

## 2018-07-16 NOTE — Telephone Encounter (Signed)
Call from [place] at [place] [facility], relays that they have picked up Mr. Holzheimer for home therapy, and are working through their charity care process. States they are behind and would like to know if okay if his home care gets scheduled for late in the week or early this weekend? Ph# [phone]

## 2018-07-16 NOTE — Telephone Encounter (Signed)
Discussed with Dr Joaquin Music, this is fine.

## 2018-07-16 NOTE — Addendum Note (Signed)
Addendum  created 07/16/18 1056 by Ollen Bowl, CRNA   Clinical Note Signed

## 2018-07-17 LAB — TYPE AND SCREEN
ABO/RH(D): O NEG
Antibody Screen: NEGATIVE
Unit division: 0
Unit division: 0

## 2018-07-17 LAB — BPAM RBC
Blood Product Expiration Date: 202008012359
Blood Product Expiration Date: 202008062359
Unit Type and Rh: 9500
Unit Type and Rh: 9500

## 2018-07-22 ENCOUNTER — Telehealth: Payer: Self-pay | Admitting: Orthopedic Surgery

## 2018-07-22 NOTE — Telephone Encounter (Signed)
Darlina Guys from [facility] called and stated that PT has been scheduled for this patient beginning on Friday, 07/25/18.

## 2018-07-29 DIAGNOSIS — Z96642 Presence of left artificial hip joint: Secondary | ICD-10-CM | POA: Insufficient documentation

## 2018-07-30 ENCOUNTER — Encounter: Payer: Self-pay | Admitting: Orthopedic Surgery

## 2018-07-30 ENCOUNTER — Ambulatory Visit (INDEPENDENT_AMBULATORY_CARE_PROVIDER_SITE_OTHER): Payer: Self-pay | Admitting: Orthopedic Surgery

## 2018-07-30 ENCOUNTER — Other Ambulatory Visit: Payer: Self-pay

## 2018-07-30 DIAGNOSIS — Z96642 Presence of left artificial hip joint: Secondary | ICD-10-CM

## 2018-07-30 MED ORDER — HYDROCODONE-ACETAMINOPHEN 7.5-325 MG PO TABS: 1.0000 | ORAL_TABLET | ORAL | 0 refills | Status: DC | PRN

## 2018-07-30 NOTE — Progress Notes (Signed)
Post op   Left tha   Chief Complaint  Patient presents with  . Routine Post Op    left hip replacement 07/15/2018    Wound clean, staples extracted  Walker using appropriately  Meds ordered this encounter  Medications  . HYDROcodone-acetaminophen (NORCO) 7.5-325 MG tablet    Sig: Take 1 tablet by mouth every 4 (four) hours as needed for moderate pain or severe pain.    Dispense:  42 tablet    Refill:  0    Gradual increase in activity follow-up 4 weeks

## 2018-08-08 ENCOUNTER — Encounter: Payer: Self-pay | Admitting: Orthopedic Surgery

## 2018-08-20 ENCOUNTER — Other Ambulatory Visit: Payer: Self-pay | Admitting: Physician Assistant

## 2018-08-20 DIAGNOSIS — Z125 Encounter for screening for malignant neoplasm of prostate: Secondary | ICD-10-CM

## 2018-08-20 DIAGNOSIS — R7303 Prediabetes: Secondary | ICD-10-CM

## 2018-08-20 DIAGNOSIS — I10 Essential (primary) hypertension: Secondary | ICD-10-CM

## 2018-08-20 DIAGNOSIS — E785 Hyperlipidemia, unspecified: Secondary | ICD-10-CM

## 2018-08-27 ENCOUNTER — Ambulatory Visit: Payer: Self-pay

## 2018-08-27 ENCOUNTER — Ambulatory Visit (INDEPENDENT_AMBULATORY_CARE_PROVIDER_SITE_OTHER): Payer: Self-pay | Admitting: Orthopedic Surgery

## 2018-08-27 ENCOUNTER — Other Ambulatory Visit: Payer: Self-pay

## 2018-08-27 VITALS — Ht 68.0 in | Wt 176.0 lb

## 2018-08-27 DIAGNOSIS — G8918 Other acute postprocedural pain: Secondary | ICD-10-CM

## 2018-08-27 DIAGNOSIS — Z96642 Presence of left artificial hip joint: Secondary | ICD-10-CM

## 2018-08-27 MED ORDER — HYDROCODONE-ACETAMINOPHEN 5-325 MG PO TABS: 1.0000 | ORAL_TABLET | Freq: Three times a day (TID) | ORAL | 0 refills | Status: AC | PRN

## 2018-08-27 NOTE — Progress Notes (Signed)
Post op  Chief Complaint  Patient presents with  . Follow-up    Recheck on left hip replacement, DOS 07-15-18.   6 weeks post op   He is complaining of a little pain over his left hip he has a little suture knot and has a broken open there is no erythematous tender  He is walking with a cane  Would like a other prescription  He should continue his exercises see [place] in 6 weeks I did give him some pain medication to take on a limited basis  Meds ordered this encounter  Medications  . HYDROcodone-acetaminophen (NORCO/VICODIN) 5-325 MG tablet    Sig: Take 1 tablet by mouth every 8 (eight) hours as needed for up to 7 days for moderate pain.    Dispense:  21 tablet    Refill:  0   Fu 6 weeks

## 2018-08-29 ENCOUNTER — Other Ambulatory Visit (HOSPITAL_COMMUNITY)
Admission: RE | Admit: 2018-08-29 | Discharge: 2018-08-29 | Disposition: A | Discharge status: Home / Self Care | Payer: Self-pay | Source: Ambulatory Visit | Attending: Physician Assistant | Admitting: Physician Assistant

## 2018-08-29 DIAGNOSIS — I10 Essential (primary) hypertension: Secondary | ICD-10-CM | POA: Insufficient documentation

## 2018-08-29 DIAGNOSIS — R7303 Prediabetes: Secondary | ICD-10-CM | POA: Insufficient documentation

## 2018-08-29 DIAGNOSIS — E785 Hyperlipidemia, unspecified: Secondary | ICD-10-CM | POA: Insufficient documentation

## 2018-08-29 DIAGNOSIS — Z125 Encounter for screening for malignant neoplasm of prostate: Secondary | ICD-10-CM | POA: Insufficient documentation

## 2018-08-29 LAB — COMPREHENSIVE METABOLIC PANEL
ALT: 47 U/L — ABNORMAL HIGH (ref 0–44)
AST: 32 U/L (ref 15–41)
Albumin: 4.5 g/dL (ref 3.5–5.0)
Alkaline Phosphatase: 96 U/L (ref 38–126)
Anion gap: 11 (ref 5–15)
BUN: 10 mg/dL (ref 6–20)
CO2: 25 mmol/L (ref 22–32)
Calcium: 9.8 mg/dL (ref 8.9–10.3)
Chloride: 105 mmol/L (ref 98–111)
Creatinine, Ser: 0.91 mg/dL (ref 0.61–1.24)
GFR calc Af Amer: >60 mL/min (ref >60)
GFR calc non Af Amer: >60 mL/min (ref >60)
Glucose, Bld: 114 mg/dL — ABNORMAL HIGH (ref 70–99)
Potassium: 5.7 mmol/L — ABNORMAL HIGH (ref 3.5–5.1)
Sodium: 141 mmol/L (ref 135–145)
Total Bilirubin: 1.4 mg/dL — ABNORMAL HIGH (ref 0.3–1.2)
Total Protein: 7.8 g/dL (ref 6.5–8.1)

## 2018-08-29 LAB — LIPID PANEL
Cholesterol: 161 mg/dL (ref 0–200)
HDL: 68 mg/dL (ref >40)
LDL Cholesterol: 83 mg/dL (ref 0–99)
Total CHOL/HDL Ratio: 2.4 RATIO
Triglycerides: 50 mg/dL (ref <150)
VLDL: 10 mg/dL (ref 0–40)

## 2018-08-29 LAB — PSA: Prostatic Specific Antigen: 0.33 ng/mL (ref 0.00–4.00)

## 2018-08-29 LAB — HEMOGLOBIN A1C
Hgb A1c MFr Bld: 5.8 % — ABNORMAL HIGH (ref 4.8–5.6)
Mean Plasma Glucose: 119.76 mg/dL

## 2018-09-01 ENCOUNTER — Ambulatory Visit: Payer: Self-pay | Admitting: Physician Assistant

## 2018-10-08 ENCOUNTER — Other Ambulatory Visit (HOSPITAL_COMMUNITY)
Admission: RE | Admit: 2018-10-08 | Discharge: 2018-10-08 | Disposition: A | Discharge status: Home / Self Care | Payer: Self-pay | Source: Ambulatory Visit | Attending: Physician Assistant | Admitting: Physician Assistant

## 2018-10-08 ENCOUNTER — Encounter: Payer: Self-pay | Admitting: Orthopedic Surgery

## 2018-10-08 ENCOUNTER — Other Ambulatory Visit: Payer: Self-pay

## 2018-10-08 ENCOUNTER — Ambulatory Visit (INDEPENDENT_AMBULATORY_CARE_PROVIDER_SITE_OTHER): Payer: Self-pay | Admitting: Orthopedic Surgery

## 2018-10-08 ENCOUNTER — Encounter: Payer: Self-pay | Admitting: Physician Assistant

## 2018-10-08 ENCOUNTER — Ambulatory Visit: Payer: Self-pay | Admitting: Physician Assistant

## 2018-10-08 VITALS — BP 170/92 | HR 87 | Temp 98.2°F | Ht 70.0 in | Wt 178.6 lb

## 2018-10-08 VITALS — BP 160/92 | HR 100 | Temp 98.6°F | Wt 177.6 lb

## 2018-10-08 DIAGNOSIS — Z96641 Presence of right artificial hip joint: Secondary | ICD-10-CM

## 2018-10-08 DIAGNOSIS — E785 Hyperlipidemia, unspecified: Secondary | ICD-10-CM

## 2018-10-08 DIAGNOSIS — Z789 Other specified health status: Secondary | ICD-10-CM

## 2018-10-08 DIAGNOSIS — E876 Hypokalemia: Secondary | ICD-10-CM | POA: Insufficient documentation

## 2018-10-08 DIAGNOSIS — F1090 Alcohol use, unspecified, uncomplicated: Secondary | ICD-10-CM

## 2018-10-08 DIAGNOSIS — I10 Essential (primary) hypertension: Secondary | ICD-10-CM

## 2018-10-08 DIAGNOSIS — R7303 Prediabetes: Secondary | ICD-10-CM

## 2018-10-08 DIAGNOSIS — D649 Anemia, unspecified: Secondary | ICD-10-CM

## 2018-10-08 DIAGNOSIS — Z9119 Patient's noncompliance with other medical treatment and regimen: Secondary | ICD-10-CM

## 2018-10-08 DIAGNOSIS — F17200 Nicotine dependence, unspecified, uncomplicated: Secondary | ICD-10-CM

## 2018-10-08 DIAGNOSIS — H9193 Unspecified hearing loss, bilateral: Secondary | ICD-10-CM

## 2018-10-08 DIAGNOSIS — Z91199 Patient's noncompliance with other medical treatment and regimen due to unspecified reason: Secondary | ICD-10-CM

## 2018-10-08 DIAGNOSIS — Z96642 Presence of left artificial hip joint: Secondary | ICD-10-CM

## 2018-10-08 LAB — POTASSIUM: Potassium: 4.1 mmol/L (ref 3.5–5.1)

## 2018-10-08 NOTE — Progress Notes (Signed)
BP (!) 160/92   Pulse 100   Temp 98.6 F (37 C)   Wt 177 lb 9.6 oz (80.6 kg)   SpO2 96%   BMI 25.48 kg/m    Subjective:    Patient ID: WIL SLAPE, male    DOB: 12-04-1965, 53 y.o.   MRN: [id number]  HPI: Cameron Flynn is a 53 y.o. male presenting on 10/08/2018 for Hypertension   HPI    Pt had a negative covid 19 screening questionnaire.      Pt got his labs drawn end of August but then he was a no-show to his appointment.     Pt has his meds.  He says he just stopped taking them.  No reason, he just stopped them.    He continues to smoke.  He is still drinking a lot.     Relevant past medical, surgical, family and social history reviewed and updated as indicated. Interim medical history since our last visit reviewed. Allergies and medications reviewed and updated.  Current meds: none  Review of Systems  Per HPI unless specifically indicated above     Objective:    BP (!) 160/92   Pulse 100   Temp 98.6 F (37 C)   Wt 177 lb 9.6 oz (80.6 kg)   SpO2 96%   BMI 25.48 kg/m   Wt Readings from Last 3 Encounters:  10/08/18 177 lb 9.6 oz (80.6 kg)  10/08/18 178 lb 9.6 oz (81 kg)  08/27/18 176 lb (79.8 kg)    Physical Exam Vitals signs reviewed.  Constitutional:      General: He is not in acute distress.    Appearance: Normal appearance. He is well-developed. He is not ill-appearing.  HENT:     Head: Normocephalic and atraumatic.  Neck:     Musculoskeletal: Neck supple.  Cardiovascular:     Rate and Rhythm: Normal rate and regular rhythm.  Pulmonary:     Effort: Pulmonary effort is normal.     Breath sounds: Normal breath sounds. No wheezing.  Abdominal:     General: Bowel sounds are normal.     Palpations: Abdomen is soft.     Tenderness: There is no abdominal tenderness.  Musculoskeletal:     Right lower leg: No edema.     Left lower leg: No edema.  Lymphadenopathy:     Cervical: No cervical adenopathy.  Skin:    General: Skin is  warm and dry.  Neurological:     Mental Status: He is alert and oriented to person, place, and time.  Psychiatric:        Attention and Perception: Attention normal.        Speech: Speech normal.     Comments: Pt argumentative     Results for orders placed or performed during the hospital encounter of 08/29/18  Comprehensive metabolic panel  Result Value Ref Range   Sodium 141 135 - 145 mmol/L   Potassium 5.7 (H) 3.5 - 5.1 mmol/L   Chloride 105 98 - 111 mmol/L   CO2 25 22 - 32 mmol/L   Glucose, Bld 114 (H) 70 - 99 mg/dL   BUN 10 6 - 20 mg/dL   Creatinine, Ser 5.95 0.61 - 1.24 mg/dL   Calcium 9.8 8.9 - 63.8 mg/dL   Total Protein 7.8 6.5 - 8.1 g/dL   Albumin 4.5 3.5 - 5.0 g/dL   AST 32 15 - 41 U/L   ALT 47 (H) 0 - 44 U/L  Alkaline Phosphatase 96 38 - 126 U/L   Total Bilirubin 1.4 (H) 0.3 - 1.2 mg/dL   GFR calc non Af Amer >60 >60 mL/min   GFR calc Af Amer >60 >60 mL/min   Anion gap 11 5 - 15  Hemoglobin A1c  Result Value Ref Range   Hgb A1c MFr Bld 5.8 (H) 4.8 - 5.6 %   Mean Plasma Glucose 119.76 mg/dL  PSA  Result Value Ref Range   Prostatic Specific Antigen 0.33 0.00 - 4.00 ng/mL  Lipid panel  Result Value Ref Range   Cholesterol 161 0 - 200 mg/dL   Triglycerides 50 <150 mg/dL   HDL 68 >40 mg/dL   Total CHOL/HDL Ratio 2.4 RATIO   VLDL 10 0 - 40 mg/dL   LDL Cholesterol 83 0 - 99 mg/dL      Assessment & Plan:    Encounter Diagnoses  Name Primary?  . Essential hypertension Yes  . Hypokalemia   . Personal history of noncompliance with medical treatment, presenting hazards to health   . Hyperlipidemia, unspecified hyperlipidemia type   . Prediabetes   . Tobacco use disorder   . Heavy alcohol use   . Bilateral hearing loss, unspecified hearing loss type      -reviewed labs with pt -pt encouraged to Get K+ draWn TODAY -pt counseled to Get back on his meds -encouraged smoking cessation -pt to follow up 3 months.  He is to contact office sooner prn

## 2018-10-08 NOTE — Progress Notes (Signed)
Chief Complaint  Patient presents with  . Post-op Follow-up    hip   07/15/2018 Surgery left  tha   02/11/2018 Surgery rt tha   [place] is doing well after his most recent left total hip.  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 Post-op Follow-up    hip    Recommend follow-up in February for x-rays both hips and pelvis.  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

## 2018-10-08 NOTE — Patient Instructions (Addendum)
Ok to do [employer]

## 2019-01-06 ENCOUNTER — Other Ambulatory Visit (HOSPITAL_COMMUNITY)
Admission: RE | Admit: 2019-01-06 | Discharge: 2019-01-06 | Disposition: A | Discharge status: Home / Self Care | Payer: Self-pay | Source: Ambulatory Visit | Attending: Physician Assistant | Admitting: Physician Assistant

## 2019-01-06 DIAGNOSIS — I10 Essential (primary) hypertension: Secondary | ICD-10-CM | POA: Insufficient documentation

## 2019-01-06 DIAGNOSIS — E785 Hyperlipidemia, unspecified: Secondary | ICD-10-CM | POA: Insufficient documentation

## 2019-01-06 DIAGNOSIS — E876 Hypokalemia: Secondary | ICD-10-CM | POA: Insufficient documentation

## 2019-01-06 LAB — LIPID PANEL
Cholesterol: 111 mg/dL (ref 0–200)
HDL: 65 mg/dL (ref >40)
LDL Cholesterol: 36 mg/dL (ref 0–99)
Total CHOL/HDL Ratio: 1.7 RATIO
Triglycerides: 48 mg/dL (ref <150)
VLDL: 10 mg/dL (ref 0–40)

## 2019-01-06 LAB — COMPREHENSIVE METABOLIC PANEL
ALT: 60 U/L — ABNORMAL HIGH (ref 0–44)
AST: 37 U/L (ref 15–41)
Albumin: 4.2 g/dL (ref 3.5–5.0)
Alkaline Phosphatase: 99 U/L (ref 38–126)
Anion gap: 11 (ref 5–15)
BUN: 8 mg/dL (ref 6–20)
CO2: 24 mmol/L (ref 22–32)
Calcium: 9 mg/dL (ref 8.9–10.3)
Chloride: 102 mmol/L (ref 98–111)
Creatinine, Ser: 0.85 mg/dL (ref 0.61–1.24)
GFR calc Af Amer: >60 mL/min (ref >60)
GFR calc non Af Amer: >60 mL/min (ref >60)
Glucose, Bld: 106 mg/dL — ABNORMAL HIGH (ref 70–99)
Potassium: 4.2 mmol/L (ref 3.5–5.1)
Sodium: 137 mmol/L (ref 135–145)
Total Bilirubin: 1.8 mg/dL — ABNORMAL HIGH (ref 0.3–1.2)
Total Protein: 7.6 g/dL (ref 6.5–8.1)

## 2019-01-08 ENCOUNTER — Ambulatory Visit: Payer: Self-pay | Admitting: Physician Assistant

## 2019-01-08 ENCOUNTER — Encounter: Payer: Self-pay | Admitting: Physician Assistant

## 2019-01-08 DIAGNOSIS — F17200 Nicotine dependence, unspecified, uncomplicated: Secondary | ICD-10-CM

## 2019-01-08 DIAGNOSIS — Z91199 Patient's noncompliance with other medical treatment and regimen due to unspecified reason: Secondary | ICD-10-CM

## 2019-01-08 DIAGNOSIS — E785 Hyperlipidemia, unspecified: Secondary | ICD-10-CM

## 2019-01-08 DIAGNOSIS — Z9119 Patient's noncompliance with other medical treatment and regimen: Secondary | ICD-10-CM

## 2019-01-08 DIAGNOSIS — I10 Essential (primary) hypertension: Secondary | ICD-10-CM

## 2019-01-08 NOTE — Progress Notes (Signed)
There were no vitals taken for this visit.   Subjective:    Patient ID: Cameron Flynn, male    DOB: 1965/10/25, 54 y.o.   MRN: [id number]  HPI: Cameron Flynn is a 54 y.o. male presenting on 01/08/2019 for No chief complaint on file.   HPI  This is a telemedicine appointment due to coronavirus pandemic.  It is via telephone as pt does not have a video enabled device.  I connected with  [name] on 01/10/19 by a video enabled telemedicine application and verified that I am speaking with the correct person using two identifiers.   I discussed the limitations of evaluation and management by telemedicine. The patient expressed understanding and agreed to proceed.  Pt is at home.  Provider is at office.      Pt is only taking meds every other day.   He says he is about to run out and hasn't submitted his renewal application to [facility] yet.     Relevant past medical, surgical, family and social history reviewed and updated as indicated. Interim medical history since our last visit reviewed. Allergies and medications reviewed and updated.   Current Outpatient Medications:  .  atorvastatin (LIPITOR) 20 MG tablet, TAKE 1 Tablet BY MOUTH ONCE DAILY, Disp: 90 tablet, Rfl: 1 .  lisinopril (ZESTRIL) 40 MG tablet, TAKE 1 Tablet BY MOUTH ONCE DAILY, Disp: 90 tablet, Rfl: 1 .  aspirin EC 325 MG EC tablet, Take 1 tablet (325 mg total) by mouth daily with breakfast. (Patient not taking: Reported on 10/08/2018), Disp: 30 tablet, Rfl: 0 .  ibuprofen (ADVIL) 200 MG tablet, Take 400 mg by mouth every 8 (eight) hours as needed (pain)., Disp: , Rfl:      Review of Systems  Per HPI unless specifically indicated above     Objective:    There were no vitals taken for this visit.  Wt Readings from Last 3 Encounters:  10/08/18 177 lb 9.6 oz (80.6 kg)  10/08/18 178 lb 9.6 oz (81 kg)  08/27/18 176 lb (79.8 kg)    Physical Exam Pulmonary:     Effort: Pulmonary effort is normal. No  respiratory distress.  Neurological:     Mental Status: He is alert and oriented to person, place, and time.  Psychiatric:        Attention and Perception: Attention normal.        Speech: Speech normal.        Behavior: Behavior is cooperative.     Results for orders placed or performed during the hospital encounter of 01/06/19  Lipid panel  Result Value Ref Range   Cholesterol 111 0 - 200 mg/dL   Triglycerides 48 <671 mg/dL   HDL 65 >24 mg/dL   Total CHOL/HDL Ratio 1.7 RATIO   VLDL 10 0 - 40 mg/dL   LDL Cholesterol 36 0 - 99 mg/dL  Comprehensive metabolic panel  Result Value Ref Range   Sodium 137 135 - 145 mmol/L   Potassium 4.2 3.5 - 5.1 mmol/L   Chloride 102 98 - 111 mmol/L   CO2 24 22 - 32 mmol/L   Glucose, Bld 106 (H) 70 - 99 mg/dL   BUN 8 6 - 20 mg/dL   Creatinine, Ser 5.80 0.61 - 1.24 mg/dL   Calcium 9.0 8.9 - 99.8 mg/dL   Total Protein 7.6 6.5 - 8.1 g/dL   Albumin 4.2 3.5 - 5.0 g/dL   AST 37 15 - 41 U/L   ALT  60 (H) 0 - 44 U/L   Alkaline Phosphatase 99 38 - 126 U/L   Total Bilirubin 1.8 (H) 0.3 - 1.2 mg/dL   GFR calc non Af Amer >60 >60 mL/min   GFR calc Af Amer >60 >60 mL/min   Anion gap 11 5 - 15      Assessment & Plan:    Encounter Diagnoses  Name Primary?  . Essential hypertension Yes  . Hyperlipidemia, unspecified hyperlipidemia type   . Tobacco use disorder   . Personal history of noncompliance with medical treatment, presenting hazards to health       -pt was given automatic BP machine by office to monitor his BP at home.  Spent 30 minutes working with pt but he was unable to operate it.  Nurse also worked with pt on trying to get him to understand.  -nurse discussed medassist renewal with pt  -pt is encouraged to take his meds every day  -due to length of time spent with pt working on trying to get bp, pt will be scheduled for another appointment next week.  He is encouraged to read the directions for his BP machine and practice working  with it

## 2019-01-14 ENCOUNTER — Ambulatory Visit: Payer: Self-pay | Admitting: Physician Assistant

## 2019-01-14 ENCOUNTER — Encounter: Payer: Self-pay | Admitting: Physician Assistant

## 2019-01-14 VITALS — BP 127/74

## 2019-01-14 DIAGNOSIS — F17200 Nicotine dependence, unspecified, uncomplicated: Secondary | ICD-10-CM

## 2019-01-14 DIAGNOSIS — R7989 Other specified abnormal findings of blood chemistry: Secondary | ICD-10-CM

## 2019-01-14 DIAGNOSIS — F1090 Alcohol use, unspecified, uncomplicated: Secondary | ICD-10-CM

## 2019-01-14 DIAGNOSIS — Z789 Other specified health status: Secondary | ICD-10-CM

## 2019-01-14 DIAGNOSIS — E785 Hyperlipidemia, unspecified: Secondary | ICD-10-CM

## 2019-01-14 DIAGNOSIS — I10 Essential (primary) hypertension: Secondary | ICD-10-CM

## 2019-01-14 MED ORDER — ATORVASTATIN CALCIUM 20 MG PO TABS: 20.0000 mg | ORAL_TABLET | Freq: Every day | ORAL | 1 refills | Status: DC

## 2019-01-14 MED ORDER — LISINOPRIL 40 MG PO TABS: 40.0000 mg | ORAL_TABLET | Freq: Every day | ORAL | 1 refills | Status: DC

## 2019-01-14 NOTE — Progress Notes (Signed)
BP 127/74    Subjective:    Patient ID: Cameron Flynn, male    DOB: 1965-06-11, 54 y.o.   MRN: [id number]  HPI: Cameron Flynn is a 54 y.o. male presenting on 01/14/2019 for No chief complaint on file.   HPI   This is a telemedicine appointment due to coronavirus pandemic.  It is via telephone as pt does not have a video enabled device.   I connected with  [name] on 01/14/19 by a video enabled telemedicine application and verified that I am speaking with the correct person using two identifiers.   I discussed the limitations of evaluation and management by telemedicine. The patient expressed understanding and agreed to proceed.  Pt is at home.  Provider is at office    53yo male with appointment today to follow up on HTN.   He was given an automatic bp machine.  Pt says he is fine.  He denies cp, sob.  He says he still Drinks 6 pack/day  He is monitoring his BP.  Over the last 3 days: 127/74 124/74 152/101  (pt says he hadn't taken medication yet on that day)  Pt has no complaints today.        Relevant past medical, surgical, family and social history reviewed and updated as indicated. Interim medical history since our last visit reviewed. Allergies and medications reviewed and updated.   Current Outpatient Medications:  .  atorvastatin (LIPITOR) 20 MG tablet, TAKE 1 Tablet BY MOUTH ONCE DAILY, Disp: 90 tablet, Rfl: 1 .  lisinopril (ZESTRIL) 40 MG tablet, TAKE 1 Tablet BY MOUTH ONCE DAILY, Disp: 90 tablet, Rfl: 1    Review of Systems  Per HPI unless specifically indicated above     Objective:    BP 127/74   Wt Readings from Last 3 Encounters:  10/08/18 177 lb 9.6 oz (80.6 kg)  10/08/18 178 lb 9.6 oz (81 kg)  08/27/18 176 lb (79.8 kg)    Physical Exam Pulmonary:     Effort: Pulmonary effort is normal. No respiratory distress.  Neurological:     Mental Status: He is alert and oriented to person, place, and time.  Psychiatric:      Attention and Perception: Attention normal.        Speech: Speech normal.        Behavior: Behavior is cooperative.     Results for orders placed or performed during the hospital encounter of 01/06/19  Lipid panel  Result Value Ref Range   Cholesterol 111 0 - 200 mg/dL   Triglycerides 48 <599 mg/dL   HDL 65 >35 mg/dL   Total CHOL/HDL Ratio 1.7 RATIO   VLDL 10 0 - 40 mg/dL   LDL Cholesterol 36 0 - 99 mg/dL  Comprehensive metabolic panel  Result Value Ref Range   Sodium 137 135 - 145 mmol/L   Potassium 4.2 3.5 - 5.1 mmol/L   Chloride 102 98 - 111 mmol/L   CO2 24 22 - 32 mmol/L   Glucose, Bld 106 (H) 70 - 99 mg/dL   BUN 8 6 - 20 mg/dL   Creatinine, Ser 7.01 0.61 - 1.24 mg/dL   Calcium 9.0 8.9 - 77.9 mg/dL   Total Protein 7.6 6.5 - 8.1 g/dL   Albumin 4.2 3.5 - 5.0 g/dL   AST 37 15 - 41 U/L   ALT 60 (H) 0 - 44 U/L   Alkaline Phosphatase 99 38 - 126 U/L   Total Bilirubin 1.8 (H)  0.3 - 1.2 mg/dL   GFR calc non Af Amer >60 >60 mL/min   GFR calc Af Amer >60 >60 mL/min   Anion gap 11 5 - 15      Assessment & Plan:     Encounter Diagnoses  Name Primary?  . Essential hypertension Yes  . Hyperlipidemia, unspecified hyperlipidemia type   . Tobacco use disorder   . Heavy alcohol use   . Elevated LFTs      -Reviewed labs with pt -pt to Continue current rx.  He is encouraged to take it every day as prescribed and avoid running out of it -pt encouraged to Continue to monitor bp 1 or 2 /week -recommended pt Cut back on beer to no more than 2/day -will send rx to  [facility] (free pharmacy) -pt to follow up in 3 months  He is to contact office sooner for any problems

## 2019-01-21 ENCOUNTER — Ambulatory Visit (INDEPENDENT_AMBULATORY_CARE_PROVIDER_SITE_OTHER): Payer: Self-pay | Admitting: Orthopedic Surgery

## 2019-01-21 ENCOUNTER — Ambulatory Visit: Payer: Self-pay

## 2019-01-21 ENCOUNTER — Other Ambulatory Visit: Payer: Self-pay

## 2019-01-21 VITALS — BP 132/80 | HR 72 | Temp 97.4°F | Ht 70.0 in | Wt 180.0 lb

## 2019-01-21 DIAGNOSIS — Z96642 Presence of left artificial hip joint: Secondary | ICD-10-CM

## 2019-01-21 DIAGNOSIS — M1610 Unilateral primary osteoarthritis, unspecified hip: Secondary | ICD-10-CM

## 2019-01-21 DIAGNOSIS — M1611 Unilateral primary osteoarthritis, right hip: Secondary | ICD-10-CM

## 2019-01-21 DIAGNOSIS — M1612 Unilateral primary osteoarthritis, left hip: Secondary | ICD-10-CM

## 2019-01-21 DIAGNOSIS — Z96641 Presence of right artificial hip joint: Secondary | ICD-10-CM

## 2019-01-21 NOTE — Progress Notes (Signed)
Chief Complaint  Patient presents with  . Follow-up    Recheck on hips, DOS left 07-15-18. Right 02-11-18.    No complaints   Status post bilateral total hip  He is functioning very well.  xrays normal   Gait normal   Fu 1 year   Encounter Diagnoses  Name Primary?  . 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 S/P hip replacement, left 07/15/2018   . Hip arthritis    Summary 2 chronic problems stable moderate, 2 orders but those were x-rays read with separate report and morbidity risk minimal

## 2019-04-11 ENCOUNTER — Other Ambulatory Visit: Payer: Self-pay | Admitting: Physician Assistant

## 2019-04-11 MED ORDER — ATORVASTATIN CALCIUM 20 MG PO TABS: 20.0000 mg | ORAL_TABLET | Freq: Every day | ORAL | 1 refills | Status: DC

## 2019-04-11 MED ORDER — LISINOPRIL 40 MG PO TABS: 40.0000 mg | ORAL_TABLET | Freq: Every day | ORAL | 1 refills | Status: DC

## 2019-04-13 ENCOUNTER — Other Ambulatory Visit (HOSPITAL_COMMUNITY)
Admission: RE | Admit: 2019-04-13 | Discharge: 2019-04-13 | Disposition: A | Discharge status: Home / Self Care | Payer: Self-pay | Source: Ambulatory Visit | Attending: Physician Assistant | Admitting: Physician Assistant

## 2019-04-13 DIAGNOSIS — I10 Essential (primary) hypertension: Secondary | ICD-10-CM | POA: Insufficient documentation

## 2019-04-13 DIAGNOSIS — R7989 Other specified abnormal findings of blood chemistry: Secondary | ICD-10-CM | POA: Insufficient documentation

## 2019-04-13 DIAGNOSIS — E785 Hyperlipidemia, unspecified: Secondary | ICD-10-CM | POA: Insufficient documentation

## 2019-04-13 LAB — COMPREHENSIVE METABOLIC PANEL
ALT: 103 U/L — ABNORMAL HIGH (ref 0–44)
AST: 63 U/L — ABNORMAL HIGH (ref 15–41)
Albumin: 4.4 g/dL (ref 3.5–5.0)
Alkaline Phosphatase: 89 U/L (ref 38–126)
Anion gap: 10 (ref 5–15)
BUN: 6 mg/dL (ref 6–20)
CO2: 27 mmol/L (ref 22–32)
Calcium: 9.3 mg/dL (ref 8.9–10.3)
Chloride: 99 mmol/L (ref 98–111)
Creatinine, Ser: 0.93 mg/dL (ref 0.61–1.24)
GFR calc Af Amer: >60 mL/min (ref >60)
GFR calc non Af Amer: >60 mL/min (ref >60)
Glucose, Bld: 116 mg/dL — ABNORMAL HIGH (ref 70–99)
Potassium: 4.4 mmol/L (ref 3.5–5.1)
Sodium: 136 mmol/L (ref 135–145)
Total Bilirubin: 1.7 mg/dL — ABNORMAL HIGH (ref 0.3–1.2)
Total Protein: 7.6 g/dL (ref 6.5–8.1)

## 2019-04-13 LAB — LIPID PANEL
Cholesterol: 154 mg/dL (ref 0–200)
HDL: 63 mg/dL (ref >40)
LDL Cholesterol: 77 mg/dL (ref 0–99)
Total CHOL/HDL Ratio: 2.4 RATIO
Triglycerides: 71 mg/dL (ref <150)
VLDL: 14 mg/dL (ref 0–40)

## 2019-04-14 ENCOUNTER — Encounter: Payer: Self-pay | Admitting: Physician Assistant

## 2019-04-14 ENCOUNTER — Ambulatory Visit: Payer: Self-pay | Admitting: Physician Assistant

## 2019-04-14 VITALS — BP 142/91

## 2019-04-14 DIAGNOSIS — I10 Essential (primary) hypertension: Secondary | ICD-10-CM

## 2019-04-14 DIAGNOSIS — F1090 Alcohol use, unspecified, uncomplicated: Secondary | ICD-10-CM

## 2019-04-14 DIAGNOSIS — Z789 Other specified health status: Secondary | ICD-10-CM

## 2019-04-14 DIAGNOSIS — E785 Hyperlipidemia, unspecified: Secondary | ICD-10-CM

## 2019-04-14 DIAGNOSIS — F17200 Nicotine dependence, unspecified, uncomplicated: Secondary | ICD-10-CM

## 2019-04-14 DIAGNOSIS — R7989 Other specified abnormal findings of blood chemistry: Secondary | ICD-10-CM

## 2019-04-14 NOTE — Progress Notes (Signed)
BP (!) 142/91    Subjective:    Patient ID: Flynn Cameron, male    DOB: March 14, 1965, 54 y.o.   MRN: [id number]  HPI: Cameron Flynn is a 54 y.o. male presenting on 04/14/2019 for No chief complaint on file.   HPI  This is a telemedicine appointment due to coronavirus pandemic.  It is via telephone as pt does not have a video enabled device  I connected with  Lucretia Kern on 04/14/19 by a video enabled telemedicine application and verified that I am speaking with the correct person using two identifiers.   I discussed the limitations of evaluation and management by telemedicine. The patient expressed understanding and agreed to proceed.  Pt is at home.  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    Pt is 73yoM with HTN, dyslipidemia, tobacco use disorder and very heavy alcohol consumption.  He says he is Still drinking about a 6 pack/day.  He says he is doing okay and has no complaints.     Relevant past medical, surgical, family and social history reviewed and updated as indicated. Interim medical history since our last visit reviewed. Allergies and medications reviewed and updated.    Current Outpatient Medications:  .  atorvastatin (LIPITOR) 20 MG tablet, Take 1 tablet (20 mg total) by mouth daily., Disp: 30 tablet, Rfl: 1 .  lisinopril (ZESTRIL) 40 MG tablet, Take 1 tablet (40 mg total) by mouth daily., Disp: 30 tablet, Rfl: 1   Review of Systems  Per HPI unless specifically indicated above     Objective:    BP (!) 142/91   Wt Readings from Last 3 Encounters:  01/21/19 180 lb (81.6 kg)  10/08/18 177 lb 9.6 oz (80.6 kg)  10/08/18 178 lb 9.6 oz (81 kg)    Physical Exam Pulmonary:     Effort: No respiratory distress.  Neurological:     Mental Status: He is alert and oriented to person, place, and time.  Psychiatric:        Attention and Perception: Attention normal.        Speech: Speech normal.        Behavior: Behavior is cooperative.     Results for  orders placed or performed during the hospital encounter of 04/13/19  Lipid panel  Result Value Ref Range   Cholesterol 154 0 - 200 mg/dL   Triglycerides 71 <150 mg/dL   HDL 63 >40 mg/dL   Total CHOL/HDL Ratio 2.4 RATIO   VLDL 14 0 - 40 mg/dL   LDL Cholesterol 77 0 - 99 mg/dL  Comprehensive metabolic panel  Result Value Ref Range   Sodium 136 135 - 145 mmol/L   Potassium 4.4 3.5 - 5.1 mmol/L   Chloride 99 98 - 111 mmol/L   CO2 27 22 - 32 mmol/L   Glucose, Bld 116 (H) 70 - 99 mg/dL   BUN 6 6 - 20 mg/dL   Creatinine, Ser 0.93 0.61 - 1.24 mg/dL   Calcium 9.3 8.9 - 10.3 mg/dL   Total Protein 7.6 6.5 - 8.1 g/dL   Albumin 4.4 3.5 - 5.0 g/dL   AST 63 (H) 15 - 41 U/L   ALT 103 (H) 0 - 44 U/L   Alkaline Phosphatase 89 38 - 126 U/L   Total Bilirubin 1.7 (H) 0.3 - 1.2 mg/dL   GFR calc non Af Amer >60 >60 mL/min   GFR calc Af Amer >60 >60 mL/min   Anion gap 10 5 - 15  Assessment & Plan:    Encounter Diagnoses  Name Primary?  . Essential hypertension Yes  . Hyperlipidemia, unspecified hyperlipidemia type   . Tobacco use disorder   . Elevated LFTs   . Heavy alcohol use     -Reviewed labs with pt -pt to Continue current medications -he is to continue to monitor his BP at home and contact office if it runs over 140s -he is encouraged to Limit etoh to no more than 2/day and none would be better Pt to follow up in 3 months in office.  He is to contact office sooner prn

## 2019-07-14 ENCOUNTER — Ambulatory Visit: Payer: Self-pay | Admitting: Physician Assistant

## 2019-07-15 ENCOUNTER — Other Ambulatory Visit (HOSPITAL_COMMUNITY)
Admission: RE | Admit: 2019-07-15 | Discharge: 2019-07-15 | Disposition: A | Discharge status: Home / Self Care | Payer: Self-pay | Source: Ambulatory Visit | Attending: Physician Assistant | Admitting: Physician Assistant

## 2019-07-15 DIAGNOSIS — E785 Hyperlipidemia, unspecified: Secondary | ICD-10-CM | POA: Insufficient documentation

## 2019-07-15 DIAGNOSIS — Z789 Other specified health status: Secondary | ICD-10-CM | POA: Insufficient documentation

## 2019-07-15 DIAGNOSIS — I10 Essential (primary) hypertension: Secondary | ICD-10-CM | POA: Insufficient documentation

## 2019-07-15 DIAGNOSIS — F1090 Alcohol use, unspecified, uncomplicated: Secondary | ICD-10-CM

## 2019-07-15 DIAGNOSIS — R7989 Other specified abnormal findings of blood chemistry: Secondary | ICD-10-CM | POA: Insufficient documentation

## 2019-07-15 LAB — COMPREHENSIVE METABOLIC PANEL
ALT: 91 U/L — ABNORMAL HIGH (ref 0–44)
AST: 55 U/L — ABNORMAL HIGH (ref 15–41)
Albumin: 4.7 g/dL (ref 3.5–5.0)
Alkaline Phosphatase: 98 U/L (ref 38–126)
Anion gap: 13 (ref 5–15)
BUN: 8 mg/dL (ref 6–20)
CO2: 25 mmol/L (ref 22–32)
Calcium: 9.2 mg/dL (ref 8.9–10.3)
Chloride: 100 mmol/L (ref 98–111)
Creatinine, Ser: 0.88 mg/dL (ref 0.61–1.24)
GFR calc Af Amer: >60 mL/min (ref >60)
GFR calc non Af Amer: >60 mL/min (ref >60)
Glucose, Bld: 103 mg/dL — ABNORMAL HIGH (ref 70–99)
Potassium: 4.5 mmol/L (ref 3.5–5.1)
Sodium: 138 mmol/L (ref 135–145)
Total Bilirubin: 1.1 mg/dL (ref 0.3–1.2)
Total Protein: 8 g/dL (ref 6.5–8.1)

## 2019-07-15 LAB — LIPID PANEL
Cholesterol: 176 mg/dL (ref 0–200)
HDL: 59 mg/dL (ref >40)
LDL Cholesterol: 100 mg/dL — ABNORMAL HIGH (ref 0–99)
Total CHOL/HDL Ratio: 3 RATIO
Triglycerides: 87 mg/dL (ref <150)
VLDL: 17 mg/dL (ref 0–40)

## 2019-07-16 ENCOUNTER — Ambulatory Visit: Payer: Self-pay | Admitting: Physician Assistant

## 2019-07-16 ENCOUNTER — Other Ambulatory Visit: Payer: Self-pay

## 2019-07-16 ENCOUNTER — Encounter: Payer: Self-pay | Admitting: Physician Assistant

## 2019-07-16 VITALS — BP 138/76 | HR 75 | Temp 98.8°F | Ht 70.0 in | Wt 188.4 lb

## 2019-07-16 DIAGNOSIS — E785 Hyperlipidemia, unspecified: Secondary | ICD-10-CM

## 2019-07-16 DIAGNOSIS — Z1211 Encounter for screening for malignant neoplasm of colon: Secondary | ICD-10-CM

## 2019-07-16 DIAGNOSIS — F17200 Nicotine dependence, unspecified, uncomplicated: Secondary | ICD-10-CM

## 2019-07-16 DIAGNOSIS — R7989 Other specified abnormal findings of blood chemistry: Secondary | ICD-10-CM

## 2019-07-16 DIAGNOSIS — I10 Essential (primary) hypertension: Secondary | ICD-10-CM

## 2019-07-16 DIAGNOSIS — F1090 Alcohol use, unspecified, uncomplicated: Secondary | ICD-10-CM

## 2019-07-16 DIAGNOSIS — Z125 Encounter for screening for malignant neoplasm of prostate: Secondary | ICD-10-CM

## 2019-07-16 MED ORDER — LISINOPRIL 40 MG PO TABS: 40.0000 mg | ORAL_TABLET | Freq: Every day | ORAL | 5 refills | Status: AC

## 2019-07-16 MED ORDER — ATORVASTATIN CALCIUM 20 MG PO TABS: 20.0000 mg | ORAL_TABLET | Freq: Every day | ORAL | 5 refills | Status: AC

## 2019-07-16 NOTE — Progress Notes (Signed)
BP 138/76   Pulse 75   Temp 98.8 F (37.1 C)   Ht 5\' 10"  (1.778 m)   Wt 188 lb 6.4 oz (85.5 kg)   SpO2 98%   BMI 27.03 kg/m    Subjective:    Patient ID: Cameron Flynn, male    DOB: 25-Aug-1965, 54 y.o.   MRN: [id number]  HPI: Cameron Flynn is a 54 y.o. male presenting on 07/16/2019 for Hypertension, Hyperlipidemia, and Follow-up (elevated LFTs)   HPI  Pt had a negative covid 19 screening questionnaire.    Pt is 53yoM with HTN, dyslipidemia, tobacco use disorder and very heavy alcohol consumption.  He says he is Still drinking about a 6 pack/day.  He says he is doing okay and has no complaints.   Pt has had both hip replacements.    He got covid vaccination  He has no complaints today.    Relevant past medical, surgical, family and social history reviewed and updated as indicated. Interim medical history since our last visit reviewed. Allergies and medications reviewed and updated.   Current Outpatient Medications:  .  atorvastatin (LIPITOR) 20 MG tablet, Take 1 tablet (20 mg total) by mouth daily., Disp: 30 tablet, Rfl: 1 .  lisinopril (ZESTRIL) 40 MG tablet, Take 1 tablet (40 mg total) by mouth daily., Disp: 30 tablet, Rfl: 1     Review of Systems  Per HPI unless specifically indicated above     Objective:    BP 138/76   Pulse 75   Temp 98.8 F (37.1 C)   Ht 5\' 10"  (1.778 m)   Wt 188 lb 6.4 oz (85.5 kg)   SpO2 98%   BMI 27.03 kg/m   Wt Readings from Last 3 Encounters:  07/16/19 188 lb 6.4 oz (85.5 kg)  01/21/19 180 lb (81.6 kg)  10/08/18 177 lb 9.6 oz (80.6 kg)    Physical Exam Vitals reviewed.  Constitutional:      General: He is not in acute distress.    Appearance: He is well-developed. He is not toxic-appearing.  HENT:     Head: Normocephalic and atraumatic.  Cardiovascular:     Rate and Rhythm: Normal rate and regular rhythm.  Pulmonary:     Effort: Pulmonary effort is normal.     Breath sounds: Normal breath sounds. No wheezing.   Abdominal:     General: Bowel sounds are normal.     Palpations: Abdomen is soft.     Tenderness: There is no abdominal tenderness.  Musculoskeletal:     Cervical back: Neck supple.     Right lower leg: No edema.     Left lower leg: No edema.  Lymphadenopathy:     Cervical: No cervical adenopathy.  Skin:    General: Skin is warm and dry.  Neurological:     Mental Status: He is alert and oriented to person, place, and time.  Psychiatric:        Behavior: Behavior normal.     Results for orders placed or performed during the hospital encounter of 07/15/19  Lipid panel  Result Value Ref Range   Cholesterol 176 0 - 200 mg/dL   Triglycerides 87 12/08/18 mg/dL   HDL 59 07/17/19 mg/dL   Total CHOL/HDL Ratio 3.0 RATIO   VLDL 17 0 - 40 mg/dL   LDL Cholesterol <150 (H) 0 - 99 mg/dL  Comprehensive metabolic panel  Result Value Ref Range   Sodium 138 135 - 145 mmol/L   Potassium 4.5  3.5 - 5.1 mmol/L   Chloride 100 98 - 111 mmol/L   CO2 25 22 - 32 mmol/L   Glucose, Bld 103 (H) 70 - 99 mg/dL   BUN 8 6 - 20 mg/dL   Creatinine, Ser 1.59 0.61 - 1.24 mg/dL   Calcium 9.2 8.9 - 47.0 mg/dL   Total Protein 8.0 6.5 - 8.1 g/dL   Albumin 4.7 3.5 - 5.0 g/dL   AST 55 (H) 15 - 41 U/L   ALT 91 (H) 0 - 44 U/L   Alkaline Phosphatase 98 38 - 126 U/L   Total Bilirubin 1.1 0.3 - 1.2 mg/dL   GFR calc non Af Amer >60 >60 mL/min   GFR calc Af Amer >60 >60 mL/min   Anion gap 13 5 - 15      Assessment & Plan:    Encounter Diagnoses  Name Primary?  . Essential hypertension Yes  . Hyperlipidemia, unspecified hyperlipidemia type   . Elevated LFTs   . Tobacco use disorder   . Heavy alcohol use   . Screening for colon cancer      -reviewed labs with pt -pt was given ifobt for colon cancer screening -pt to Contine current meds -encouraged pt to cut back etoh and smoking -pt to follow up 3 months.  He is to contact office sooner prn

## 2019-07-17 ENCOUNTER — Other Ambulatory Visit: Payer: Self-pay | Admitting: Physician Assistant

## 2019-07-17 DIAGNOSIS — Z1211 Encounter for screening for malignant neoplasm of colon: Secondary | ICD-10-CM

## 2020-01-18 ENCOUNTER — Ambulatory Visit: Payer: Self-pay | Admitting: Physician Assistant

## 2020-01-20 ENCOUNTER — Ambulatory Visit: Payer: Self-pay | Admitting: Orthopedic Surgery

## 2020-01-21 ENCOUNTER — Ambulatory Visit: Payer: Self-pay | Admitting: Physician Assistant

## 2020-02-10 ENCOUNTER — Ambulatory Visit: Payer: Self-pay | Admitting: Physician Assistant

## 2020-02-11 ENCOUNTER — Encounter: Payer: Self-pay | Admitting: Physician Assistant

## 2020-02-24 ENCOUNTER — Ambulatory Visit: Payer: Self-pay | Admitting: Orthopedic Surgery

## 2021-03-09 IMAGING — CR PORTABLE PELVIS 1-2 VIEWS
1 series · 2 of 2 positions shown · non-contrast
Comparison: February 11, 2018

CLINICAL DATA: Postop left total hip replacement.

EXAM:
PORTABLE PELVIS 1-2 VIEWS

[Series 1: ap · 0.17mm/px · 2 of 2 slices shown]
[im 1/2]
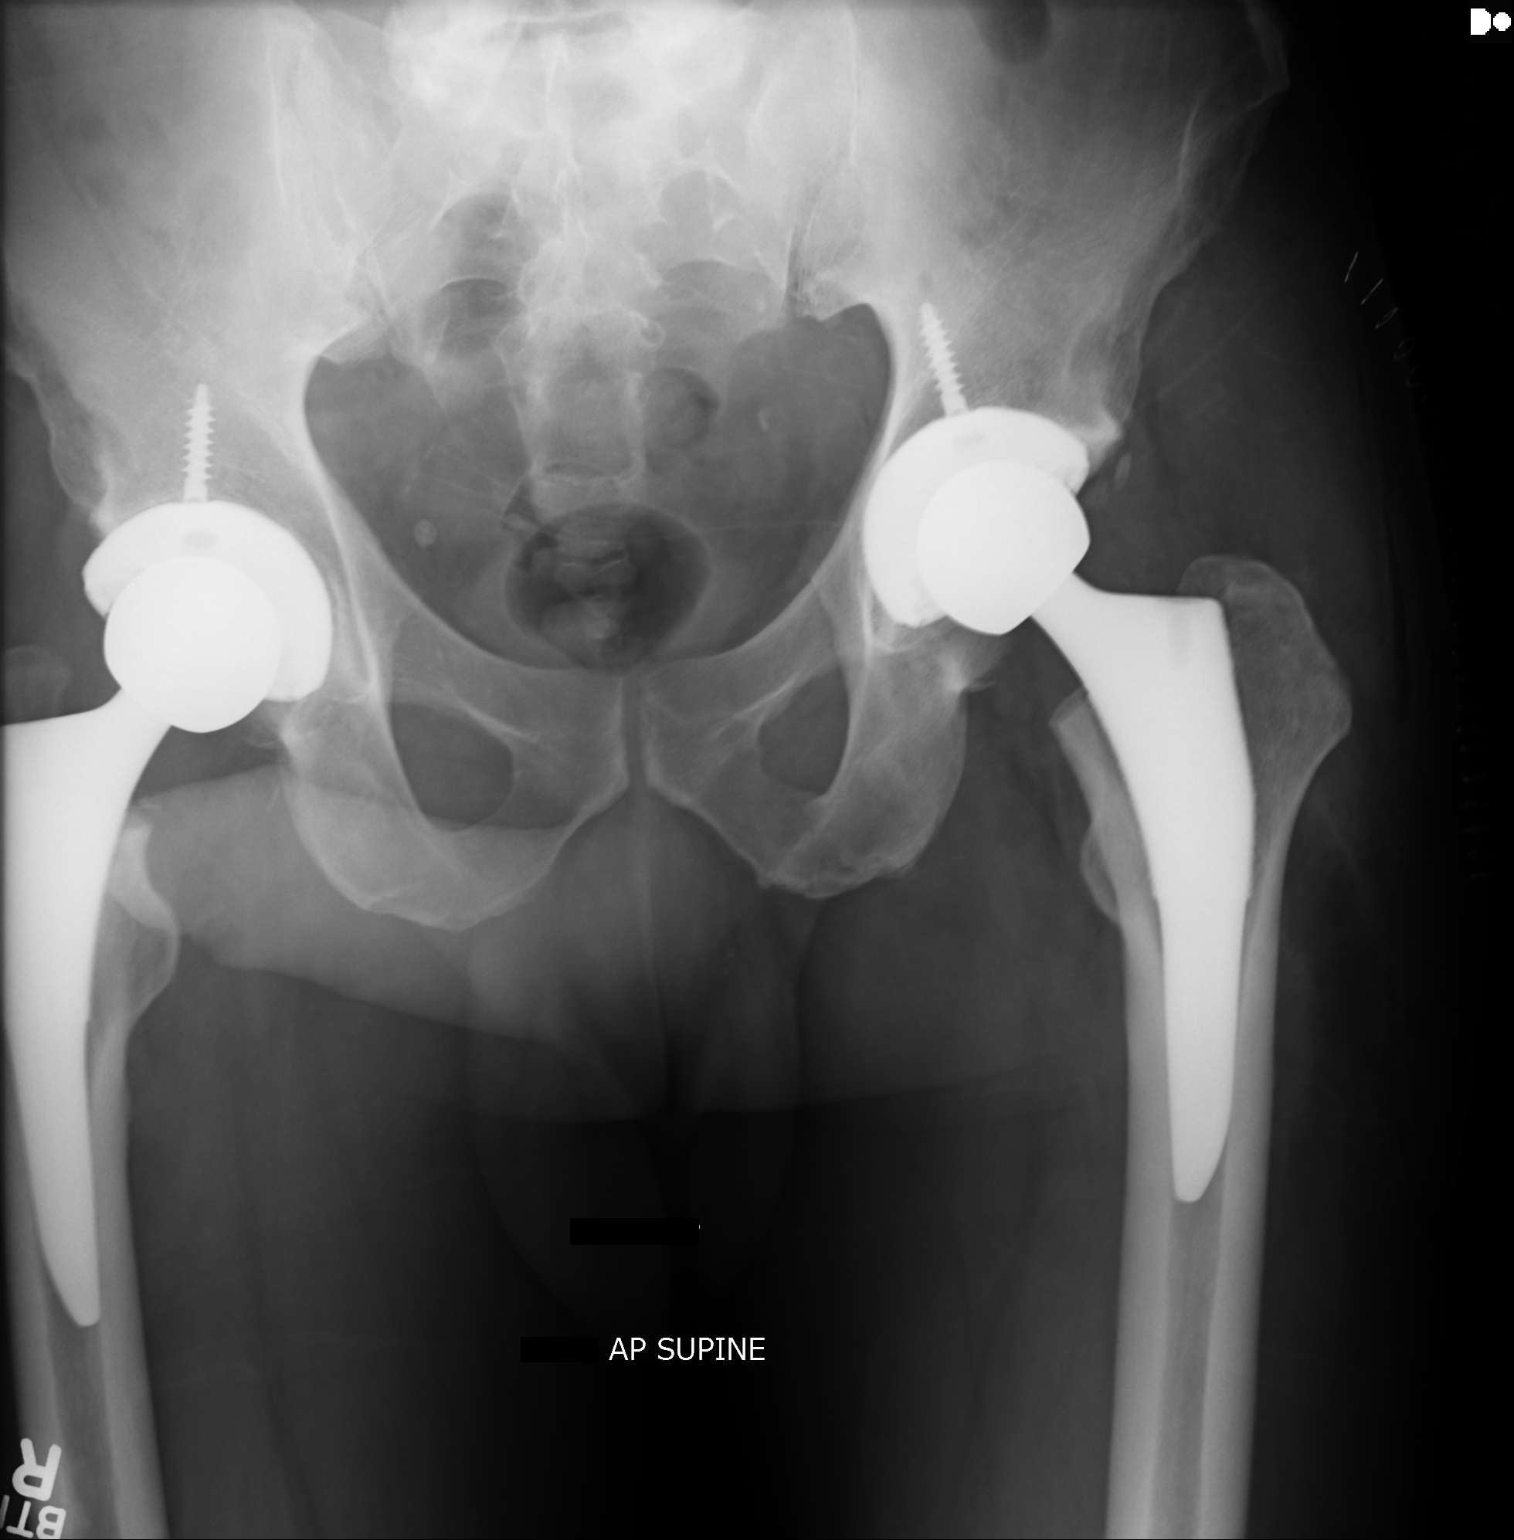
[im 2/2]
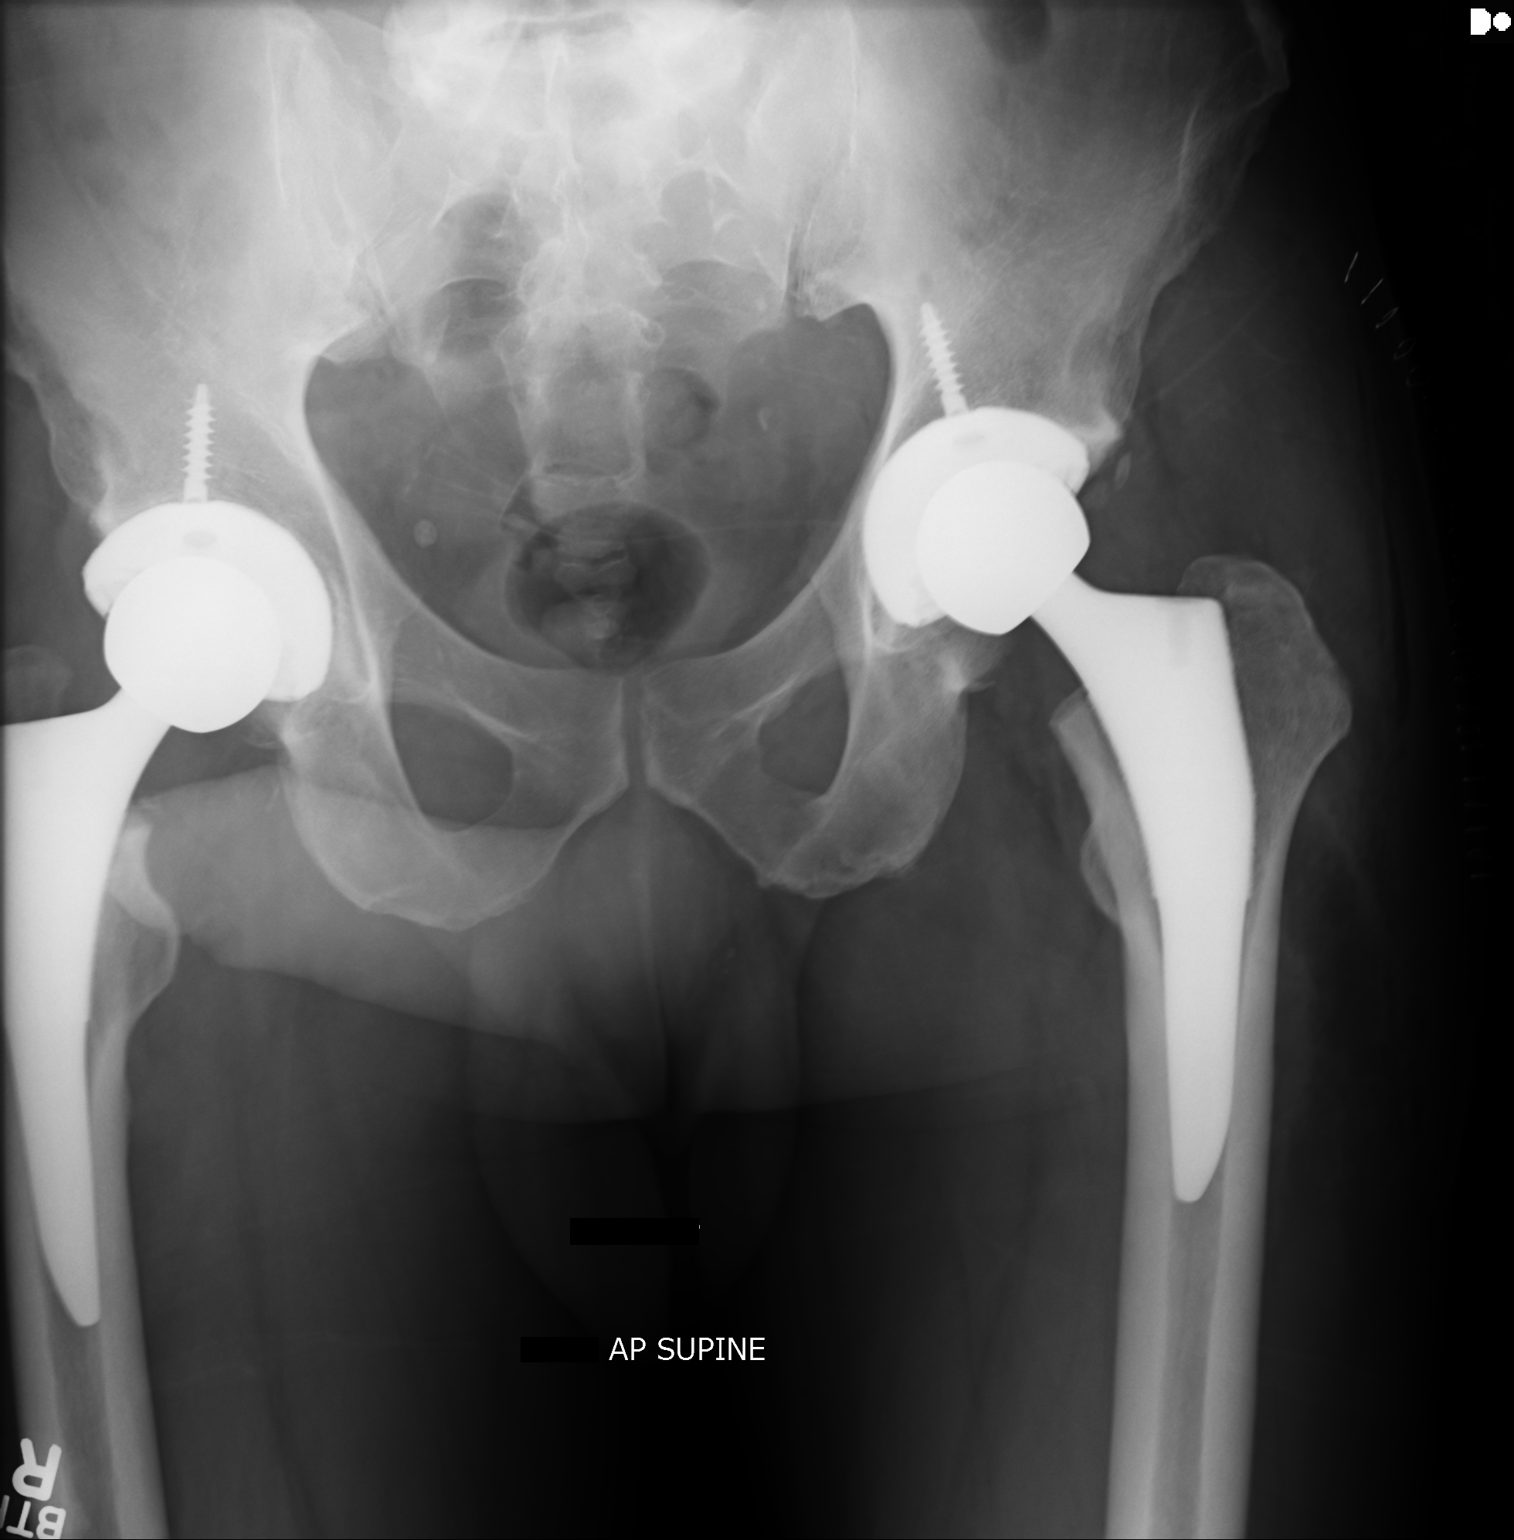

[2 of 2 positions shown; findings below may reference images not displayed]

FINDINGS: There is no acute fracture or dislocation. Bilateral total hip
replacements are identified without malalignment.
IMPRESSION: Bilateral total hip replacements are identified without
malalignment.
